# Patient Record
Sex: Male | Born: 1945 | Race: White | Hispanic: No | Marital: Married
Health system: Southern US, Community
[De-identification: ages and names within clinical notes are randomized; demographics above are authoritative.]

## PROBLEM LIST (undated history)

## (undated) DIAGNOSIS — J9621 Acute and chronic respiratory failure with hypoxia: Secondary | ICD-10-CM

## (undated) DIAGNOSIS — I5040 Unspecified combined systolic (congestive) and diastolic (congestive) heart failure: Secondary | ICD-10-CM

## (undated) DIAGNOSIS — U071 COVID-19: Secondary | ICD-10-CM

## (undated) DIAGNOSIS — J1282 Pneumonia due to coronavirus disease 2019: Secondary | ICD-10-CM

## (undated) DIAGNOSIS — I482 Chronic atrial fibrillation, unspecified: Secondary | ICD-10-CM

---

## 2019-02-14 ENCOUNTER — Inpatient Hospital Stay
Admission: RE | Admit: 2019-02-14 | Discharge: 2019-03-21 | Disposition: E | Payer: Medicare HMO | Source: Other Acute Inpatient Hospital | Attending: Internal Medicine | Admitting: Internal Medicine

## 2019-02-14 ENCOUNTER — Other Ambulatory Visit (HOSPITAL_COMMUNITY): Payer: Medicare HMO

## 2019-02-14 DIAGNOSIS — Z9911 Dependence on respirator [ventilator] status: Secondary | ICD-10-CM

## 2019-02-14 DIAGNOSIS — J811 Chronic pulmonary edema: Secondary | ICD-10-CM

## 2019-02-14 DIAGNOSIS — J90 Pleural effusion, not elsewhere classified: Secondary | ICD-10-CM

## 2019-02-14 DIAGNOSIS — J9690 Respiratory failure, unspecified, unspecified whether with hypoxia or hypercapnia: Secondary | ICD-10-CM

## 2019-02-14 DIAGNOSIS — J80 Acute respiratory distress syndrome: Secondary | ICD-10-CM

## 2019-02-14 DIAGNOSIS — J189 Pneumonia, unspecified organism: Secondary | ICD-10-CM

## 2019-02-14 DIAGNOSIS — I482 Chronic atrial fibrillation, unspecified: Secondary | ICD-10-CM | POA: Diagnosis present

## 2019-02-14 DIAGNOSIS — J1289 Other viral pneumonia: Secondary | ICD-10-CM | POA: Diagnosis present

## 2019-02-14 DIAGNOSIS — Z931 Gastrostomy status: Secondary | ICD-10-CM

## 2019-02-14 DIAGNOSIS — R748 Abnormal levels of other serum enzymes: Secondary | ICD-10-CM

## 2019-02-14 DIAGNOSIS — J9621 Acute and chronic respiratory failure with hypoxia: Secondary | ICD-10-CM | POA: Diagnosis present

## 2019-02-14 DIAGNOSIS — U071 COVID-19: Secondary | ICD-10-CM | POA: Diagnosis present

## 2019-02-14 DIAGNOSIS — I5040 Unspecified combined systolic (congestive) and diastolic (congestive) heart failure: Secondary | ICD-10-CM | POA: Diagnosis present

## 2019-02-14 HISTORY — DX: Pneumonia due to coronavirus disease 2019: J12.82

## 2019-02-14 HISTORY — DX: COVID-19: U07.1

## 2019-02-14 HISTORY — DX: Unspecified combined systolic (congestive) and diastolic (congestive) heart failure: I50.40

## 2019-02-14 HISTORY — DX: Chronic atrial fibrillation, unspecified: I48.20

## 2019-02-14 HISTORY — DX: Acute and chronic respiratory failure with hypoxia: J96.21

## 2019-02-15 DIAGNOSIS — I482 Chronic atrial fibrillation, unspecified: Secondary | ICD-10-CM

## 2019-02-15 DIAGNOSIS — J9621 Acute and chronic respiratory failure with hypoxia: Secondary | ICD-10-CM

## 2019-02-15 DIAGNOSIS — U071 COVID-19: Secondary | ICD-10-CM | POA: Diagnosis not present

## 2019-02-15 DIAGNOSIS — J1289 Other viral pneumonia: Secondary | ICD-10-CM

## 2019-02-15 DIAGNOSIS — I5040 Unspecified combined systolic (congestive) and diastolic (congestive) heart failure: Secondary | ICD-10-CM

## 2019-02-15 LAB — COMPREHENSIVE METABOLIC PANEL
ALT: 367 U/L — ABNORMAL HIGH (ref 0–44)
AST: 110 U/L — ABNORMAL HIGH (ref 15–41)
Albumin: 1.4 g/dL — ABNORMAL LOW (ref 3.5–5.0)
Alkaline Phosphatase: 171 U/L — ABNORMAL HIGH (ref 38–126)
Anion gap: 10 (ref 5–15)
BUN: 35 mg/dL — ABNORMAL HIGH (ref 8–23)
CO2: 34 mmol/L — ABNORMAL HIGH (ref 22–32)
Calcium: 7.5 mg/dL — ABNORMAL LOW (ref 8.9–10.3)
Chloride: 92 mmol/L — ABNORMAL LOW (ref 98–111)
Creatinine, Ser: 0.71 mg/dL (ref 0.61–1.24)
GFR calc Af Amer: >60 mL/min (ref >60)
GFR calc non Af Amer: >60 mL/min (ref >60)
Glucose, Bld: 65 mg/dL — ABNORMAL LOW (ref 70–99)
Potassium: 2.8 mmol/L — ABNORMAL LOW (ref 3.5–5.1)
Sodium: 136 mmol/L (ref 135–145)
Total Bilirubin: 1.6 mg/dL — ABNORMAL HIGH (ref 0.3–1.2)
Total Protein: 4.7 g/dL — ABNORMAL LOW (ref 6.5–8.1)

## 2019-02-15 LAB — CBC
HCT: 23.1 % — ABNORMAL LOW (ref 39.0–52.0)
Hemoglobin: 7.1 g/dL — ABNORMAL LOW (ref 13.0–17.0)
MCH: 29.5 pg (ref 26.0–34.0)
MCHC: 30.7 g/dL (ref 30.0–36.0)
MCV: 95.9 fL (ref 80.0–100.0)
Platelets: 147 10*3/uL — ABNORMAL LOW (ref 150–400)
RBC: 2.41 MIL/uL — ABNORMAL LOW (ref 4.22–5.81)
RDW: 22.9 % — ABNORMAL HIGH (ref 11.5–15.5)
WBC: 6.9 10*3/uL (ref 4.0–10.5)
nRBC: 0.3 % — ABNORMAL HIGH (ref 0.0–0.2)

## 2019-02-15 LAB — PREPARE RBC (CROSSMATCH)

## 2019-02-15 LAB — HEMOGLOBIN A1C
Hgb A1c MFr Bld: 6.9 % — ABNORMAL HIGH (ref 4.8–5.6)
Mean Plasma Glucose: 151.33 mg/dL

## 2019-02-15 LAB — ABO/RH: ABO/RH(D): A POS

## 2019-02-15 LAB — TSH: TSH: 3.277 u[IU]/mL (ref 0.350–4.500)

## 2019-02-15 NOTE — Consult Note (Signed)
[facility]  PULMONARY SERVICE  Date of Service: 02/15/2019  PULMONARY CRITICAL CARE Issacc Merlo  TIR:[id number]  DOB: 02/11/1946   DOA: Mar 13, 2019  Referring Physician: Merton Border, MD  HPI: Mike Butler is a 73 y.o. male seen for follow up of Acute on Chronic Respiratory Failure.  Patient has multiple medical problems including morbid obesity diabetes coronary artery disease status post CABG carotid endarterectomy hyperlipidemia chronic back pain sleep apnea atrial fibrillation who presented to the hospital because of increasing shortness of breath.  Patient apparently was diagnosed with COVID-19 on September 21 was initially sent home on oxygen but came back because of worsening condition.  Patient went on to respiratory failure ended up having a cardiac arrest and this was felt to be secondary to hypoxia.  Subsequently patient was treated for the pneumonia with the steroids remdesivir combination.  Patient subsequently was extubated but failed and had to be reintubated the following day.  Patient now transferred to our facility for further management and weaning.  Review of Systems:  ROS performed and is unremarkable other than noted above.  Past medical history: Morbid obesity Diabetes Carotid stenosis Coronary artery disease Hyperlipidemia Sick sinus syndrome Atrial fibrillation Sleep apnea Chronic pain  Past surgical history: CABG Carotid endarterectomy Pacemaker placement Cholecystectomy Bilateral knee replacement  Family history: Noncontributory  Social history: Never smoker No alcohol abuse No drug abuse  Allergies: Codeine   Medications: Reviewed on Rounds  Physical Exam:  Vitals: Temperature 97.7 pulse 79 respiratory rate 30 blood pressure 123/60 saturations 98%  Ventilator Settings mode ventilation pressure support FiO2 50% tidal volume 396 pressure poor 12 PEEP 5  . General:  Comfortable at this time . Eyes: Grossly normal lids, irises & conjunctiva . ENT: grossly tongue is normal . Neck: no obvious mass . Cardiovascular: S1-S2 normal no gallop or rub . Respiratory: No rhonchi no rales are noted at this time . Abdomen: Soft and nontender . Skin: no rash seen on limited exam . Musculoskeletal: not rigid . Psychiatric:unable to assess . Neurologic: no seizure no involuntary movements         Labs on Admission:  Basic Metabolic Panel: Recent Labs  Lab 02/15/19 0226  NA 136  K 2.8*  CL 92*  CO2 34*  GLUCOSE 65*  BUN 35*  CREATININE 0.71  CALCIUM 7.5*    No results for input(s): PHART, PCO2ART, PO2ART, HCO3, O2SAT in the last 168 hours.  Liver Function Tests: Recent Labs  Lab 02/15/19 0226  AST 110*  ALT 367*  ALKPHOS 171*  BILITOT 1.6*  PROT 4.7*  ALBUMIN 1.4*   No results for input(s): LIPASE, AMYLASE in the last 168 hours. No results for input(s): AMMONIA in the last 168 hours.  CBC: Recent Labs  Lab 02/15/19 0226  WBC 6.9  HGB 7.1*  HCT 23.1*  MCV 95.9  PLT 147*    Cardiac Enzymes: No results for input(s): CKTOTAL, CKMB, CKMBINDEX, TROPONINI in the last 168 hours.  BNP (last 3 results) No results for input(s): BNP in the last 8760 hours.  ProBNP (last 3 results) No results for input(s): PROBNP in the last 8760 hours.   Radiological Exams on Admission: Dg Abd 1 View  Result Date: 03/13/2019 CLINICAL DATA:  NG tube placement. EXAM: ABDOMEN - 1 VIEW COMPARISON:  None. FINDINGS: Enteric tube in the stomach. The visualized bowel gas pattern is normal. No radio-opaque calculi or other significant radiographic abnormality are seen. Prior lumbar fusion.  No acute osseous abnormality. IMPRESSION: Enteric tube in the stomach. Electronically Signed   By: Obie Dredge M.D.   On: 02/20/19 21:08   Dg Chest Port 1 View  Result Date: 02/20/2019 CLINICAL DATA:  Respiratory failure EXAM: PORTABLE CHEST 1 VIEW COMPARISON:  None.  FINDINGS: Cardiomegaly and findings of CABG. Left chest wall pacemaker. Mild interstitial pulmonary edema. No pneumothorax. Small left pleural effusion. Enteric tube courses below the field of view. IMPRESSION: 1. Findings of congestive heart failure: Mild interstitial pulmonary edema, cardiomegaly and small left pleural effusion. 2. Enteric tube courses below the field-of-view. Electronically Signed   By: Deatra Robinson M.D.   On: 02/20/19 22:42    Assessment/Plan Active Problems:   Acute on chronic respiratory failure with hypoxia (HCC)   COVID-19 virus infection   Pneumonia due to COVID-19 virus   Chronic atrial fibrillation (HCC)   Congestive heart failure, NYHA class 3, unspecified failure chronicity, combined (HCC)   1. Acute on chronic respiratory failure hypoxia patient is on the wean on pressure support try to advance as tolerated last chest x-ray showing some congestion and interstitial edema may benefit from diuretics. 2. COVID-19 virus infection now in recovery phase continue with present management monitoring. 3. COVID-19 pneumonia slowly improving the last chest x-ray still shows some evidence of congestive heart failure 4. Chronic atrial fibrillation rate controlled patient was put on Eliquis and also on his regular medications. 5. Congestive heart failure patient noted to have large heart and effusions on the latest chest film.  Will discuss with primary care team regarding work-up.  Patient does have a history of coronary artery disease and CABG  I have personally seen and evaluated the patient, evaluated laboratory and imaging results, formulated the assessment and plan and placed orders. The Patient requires high complexity decision making for assessment and support.  Case was discussed on Rounds with the Respiratory Therapy Staff Time Spent  Yevonne Pax, MD [facility] Pulmonary Critical Care Medicine Sleep Medicine

## 2019-02-16 ENCOUNTER — Other Ambulatory Visit (HOSPITAL_COMMUNITY): Payer: Medicare HMO

## 2019-02-16 DIAGNOSIS — I5040 Unspecified combined systolic (congestive) and diastolic (congestive) heart failure: Secondary | ICD-10-CM | POA: Diagnosis not present

## 2019-02-16 DIAGNOSIS — I482 Chronic atrial fibrillation, unspecified: Secondary | ICD-10-CM | POA: Diagnosis not present

## 2019-02-16 DIAGNOSIS — J9621 Acute and chronic respiratory failure with hypoxia: Secondary | ICD-10-CM | POA: Diagnosis not present

## 2019-02-16 DIAGNOSIS — U071 COVID-19: Secondary | ICD-10-CM | POA: Diagnosis not present

## 2019-02-16 LAB — RENAL FUNCTION PANEL
Albumin: 1.4 g/dL — ABNORMAL LOW (ref 3.5–5.0)
Anion gap: 12 (ref 5–15)
BUN: 35 mg/dL — ABNORMAL HIGH (ref 8–23)
CO2: 31 mmol/L (ref 22–32)
Calcium: 7.7 mg/dL — ABNORMAL LOW (ref 8.9–10.3)
Chloride: 92 mmol/L — ABNORMAL LOW (ref 98–111)
Creatinine, Ser: 0.65 mg/dL (ref 0.61–1.24)
GFR calc Af Amer: >60 mL/min (ref >60)
GFR calc non Af Amer: >60 mL/min (ref >60)
Glucose, Bld: 162 mg/dL — ABNORMAL HIGH (ref 70–99)
Phosphorus: 3.9 mg/dL (ref 2.5–4.6)
Potassium: 3.2 mmol/L — ABNORMAL LOW (ref 3.5–5.1)
Sodium: 135 mmol/L (ref 135–145)

## 2019-02-16 LAB — CBC
HCT: 26.6 % — ABNORMAL LOW (ref 39.0–52.0)
Hemoglobin: 8.4 g/dL — ABNORMAL LOW (ref 13.0–17.0)
MCH: 29.9 pg (ref 26.0–34.0)
MCHC: 31.6 g/dL (ref 30.0–36.0)
MCV: 94.7 fL (ref 80.0–100.0)
Platelets: 134 10*3/uL — ABNORMAL LOW (ref 150–400)
RBC: 2.81 MIL/uL — ABNORMAL LOW (ref 4.22–5.81)
RDW: 21.7 % — ABNORMAL HIGH (ref 11.5–15.5)
WBC: 6.6 10*3/uL (ref 4.0–10.5)
nRBC: 0.6 % — ABNORMAL HIGH (ref 0.0–0.2)

## 2019-02-16 LAB — BPAM RBC
Blood Product Expiration Date: 202011202359
ISSUE DATE / TIME: 202010280432
Unit Type and Rh: 6200

## 2019-02-16 LAB — TYPE AND SCREEN
ABO/RH(D): A POS
Antibody Screen: NEGATIVE
Unit division: 0

## 2019-02-16 LAB — MAGNESIUM: Magnesium: 1.8 mg/dL (ref 1.7–2.4)

## 2019-02-16 NOTE — Progress Notes (Signed)
Pulmonary Critical Care Medicine [facility] [place]   PULMONARY CRITICAL CARE SERVICE  PROGRESS NOTE  Date of Service: 02/16/2019  Dimitry Holsworth  NAT:[id number]  DOB: 05-06-1945   DOA: 03-16-2019  Referring Physician: Carron Curie, MD  HPI: Leelan Rajewski is a 73 y.o. male seen for follow up of Acute on Chronic Respiratory Failure.  Patient currently is on pressure support wean and has been doing well with 40% FiO2 good saturations are noted the goal is for about 8 hours  Medications: Reviewed on Rounds  Physical Exam:  Vitals: Temperature 96.8 pulse 79 respiratory rate 28 blood pressure 122/54 saturations 96%  Ventilator Settings mode ventilation pressure support FiO2 40% tidal volume 575 pressure support 12 PEEP 7  . General: Comfortable at this time . Eyes: Grossly normal lids, irises & conjunctiva . ENT: grossly tongue is normal . Neck: no obvious mass . Cardiovascular: S1 S2 normal no gallop . Respiratory: No rhonchi coarse breath sounds . Abdomen: soft . Skin: no rash seen on limited exam . Musculoskeletal: not rigid . Psychiatric:unable to assess . Neurologic: no seizure no involuntary movements         Lab Data:   Basic Metabolic Panel: Recent Labs  Lab 02/15/19 0226 02/16/19 0631  NA 136 135  K 2.8* 3.2*  CL 92* 92*  CO2 34* 31  GLUCOSE 65* 162*  BUN 35* 35*  CREATININE 0.71 0.65  CALCIUM 7.5* 7.7*  MG  --  1.8  PHOS  --  3.9    ABG: No results for input(s): PHART, PCO2ART, PO2ART, HCO3, O2SAT in the last 168 hours.  Liver Function Tests: Recent Labs  Lab 02/15/19 0226 02/16/19 0631  AST 110*  --   ALT 367*  --   ALKPHOS 171*  --   BILITOT 1.6*  --   PROT 4.7*  --   ALBUMIN 1.4* 1.4*   No results for input(s): LIPASE, AMYLASE in the last 168 hours. No results for input(s): AMMONIA in the last 168 hours.  CBC: Recent Labs  Lab 02/15/19 0226 02/16/19 0631  WBC 6.9 6.6  HGB 7.1* 8.4*  HCT 23.1* 26.6*  MCV 95.9 94.7   PLT 147* 134*    Cardiac Enzymes: No results for input(s): CKTOTAL, CKMB, CKMBINDEX, TROPONINI in the last 168 hours.  BNP (last 3 results) No results for input(s): BNP in the last 8760 hours.  ProBNP (last 3 results) No results for input(s): PROBNP in the last 8760 hours.  Radiological Exams: Dg Abd 1 View  Result Date: March 16, 2019 CLINICAL DATA:  NG tube placement. EXAM: ABDOMEN - 1 VIEW COMPARISON:  None. FINDINGS: Enteric tube in the stomach. The visualized bowel gas pattern is normal. No radio-opaque calculi or other significant radiographic abnormality are seen. Prior lumbar fusion. No acute osseous abnormality. IMPRESSION: Enteric tube in the stomach. Electronically Signed   By: Obie Dredge M.D.   On: 03-16-2019 21:08   Dg Chest Port 1 View  Result Date: 03-16-2019 CLINICAL DATA:  Respiratory failure EXAM: PORTABLE CHEST 1 VIEW COMPARISON:  None. FINDINGS: Cardiomegaly and findings of CABG. Left chest wall pacemaker. Mild interstitial pulmonary edema. No pneumothorax. Small left pleural effusion. Enteric tube courses below the field of view. IMPRESSION: 1. Findings of congestive heart failure: Mild interstitial pulmonary edema, cardiomegaly and small left pleural effusion. 2. Enteric tube courses below the field-of-view. Electronically Signed   By: Deatra Robinson M.D.   On: 16-Mar-2019 22:42    Assessment/Plan Active Problems:   Acute on chronic  respiratory failure with hypoxia ([place])   COVID-19 virus infection   Pneumonia due to COVID-19 virus   Chronic atrial fibrillation (HCC)   Congestive heart failure, NYHA class 3, unspecified failure chronicity, combined ([place])   1. Acute on chronic respiratory failure with hypoxia patient continues to do okay with the wean on pressure support plan is to advance as tolerated.  The goal today is 8 hours 2. COVID-19 virus infection in recovery phase 3. Pneumonia due to COVID-19 we will continue with supportive care need to keep on the  conservative fluid side 4. Congestive heart failure as above need to monitor fluid status closely 5. Chronic atrial fibrillation rate controlled   I have personally seen and evaluated the patient, evaluated laboratory and imaging results, formulated the assessment and plan and placed orders. The Patient requires high complexity decision making for assessment and support.  Case was discussed on Rounds with the Respiratory Therapy Staff  Allyne Gee, MD [facility] Pulmonary Critical Care Medicine Sleep Medicine

## 2019-02-17 ENCOUNTER — Encounter: Payer: Self-pay | Admitting: Internal Medicine

## 2019-02-17 DIAGNOSIS — J1282 Pneumonia due to coronavirus disease 2019: Secondary | ICD-10-CM | POA: Diagnosis present

## 2019-02-17 DIAGNOSIS — I5040 Unspecified combined systolic (congestive) and diastolic (congestive) heart failure: Secondary | ICD-10-CM | POA: Diagnosis present

## 2019-02-17 DIAGNOSIS — I482 Chronic atrial fibrillation, unspecified: Secondary | ICD-10-CM | POA: Diagnosis not present

## 2019-02-17 DIAGNOSIS — J9621 Acute and chronic respiratory failure with hypoxia: Secondary | ICD-10-CM | POA: Diagnosis present

## 2019-02-17 DIAGNOSIS — U071 COVID-19: Secondary | ICD-10-CM | POA: Diagnosis present

## 2019-02-17 LAB — RENAL FUNCTION PANEL
Albumin: 1.4 g/dL — ABNORMAL LOW (ref 3.5–5.0)
Anion gap: 9 (ref 5–15)
BUN: 40 mg/dL — ABNORMAL HIGH (ref 8–23)
CO2: 34 mmol/L — ABNORMAL HIGH (ref 22–32)
Calcium: 7.9 mg/dL — ABNORMAL LOW (ref 8.9–10.3)
Chloride: 93 mmol/L — ABNORMAL LOW (ref 98–111)
Creatinine, Ser: 0.68 mg/dL (ref 0.61–1.24)
GFR calc Af Amer: >60 mL/min (ref >60)
GFR calc non Af Amer: >60 mL/min (ref >60)
Glucose, Bld: 144 mg/dL — ABNORMAL HIGH (ref 70–99)
Phosphorus: 3.5 mg/dL (ref 2.5–4.6)
Potassium: 3 mmol/L — ABNORMAL LOW (ref 3.5–5.1)
Sodium: 136 mmol/L (ref 135–145)

## 2019-02-17 LAB — CBC
HCT: 24.3 % — ABNORMAL LOW (ref 39.0–52.0)
Hemoglobin: 7.6 g/dL — ABNORMAL LOW (ref 13.0–17.0)
MCH: 30.3 pg (ref 26.0–34.0)
MCHC: 31.3 g/dL (ref 30.0–36.0)
MCV: 96.8 fL (ref 80.0–100.0)
Platelets: 161 10*3/uL (ref 150–400)
RBC: 2.51 MIL/uL — ABNORMAL LOW (ref 4.22–5.81)
RDW: 22 % — ABNORMAL HIGH (ref 11.5–15.5)
WBC: 5.6 10*3/uL (ref 4.0–10.5)
nRBC: 1.1 % — ABNORMAL HIGH (ref 0.0–0.2)

## 2019-02-17 LAB — MAGNESIUM: Magnesium: 1.7 mg/dL (ref 1.7–2.4)

## 2019-02-17 NOTE — Progress Notes (Signed)
Pulmonary Critical Care Medicine [facility] [place]   PULMONARY CRITICAL CARE SERVICE  PROGRESS NOTE  Date of Service: 02/17/2019  Mike Butler  VQM:[id number]  DOB: 06/13/1945   DOA: 02/13/2019  Referring Physician: Carron Curie, MD  HPI: Mike Butler is a 73 y.o. male seen for follow up of Acute on Chronic Respiratory Failure.  Patient currently is on pressure support mode 12/7 has been tolerating fairly well today  Medications: Reviewed on Rounds  Physical Exam:  Vitals: Temperature 98.7 pulse 74 respiratory 25 blood pressure 132/63 saturations 95%  Ventilator Settings mode ventilation pressure support FiO2 35% tidal volume 393 pressure poor 12 PEEP 7  . General: Comfortable at this time . Eyes: Grossly normal lids, irises & conjunctiva . ENT: grossly tongue is normal . Neck: no obvious mass . Cardiovascular: S1 S2 normal no gallop . Respiratory: No rhonchi no rales are noted at this time . Abdomen: soft . Skin: no rash seen on limited exam . Musculoskeletal: not rigid . Psychiatric:unable to assess . Neurologic: no seizure no involuntary movements         Lab Data:   Basic Metabolic Panel: Recent Labs  Lab 02/15/19 0226 02/16/19 0631 02/17/19 0500  NA 136 135 136  K 2.8* 3.2* 3.0*  CL 92* 92* 93*  CO2 34* 31 34*  GLUCOSE 65* 162* 144*  BUN 35* 35* 40*  CREATININE 0.71 0.65 0.68  CALCIUM 7.5* 7.7* 7.9*  MG  --  1.8 1.7  PHOS  --  3.9 3.5    ABG: No results for input(s): PHART, PCO2ART, PO2ART, HCO3, O2SAT in the last 168 hours.  Liver Function Tests: Recent Labs  Lab 02/15/19 0226 02/16/19 0631 02/17/19 0500  AST 110*  --   --   ALT 367*  --   --   ALKPHOS 171*  --   --   BILITOT 1.6*  --   --   PROT 4.7*  --   --   ALBUMIN 1.4* 1.4* 1.4*   No results for input(s): LIPASE, AMYLASE in the last 168 hours. No results for input(s): AMMONIA in the last 168 hours.  CBC: Recent Labs  Lab 02/15/19 0226 02/16/19 0631  02/17/19 0500  WBC 6.9 6.6 5.6  HGB 7.1* 8.4* 7.6*  HCT 23.1* 26.6* 24.3*  MCV 95.9 94.7 96.8  PLT 147* 134* 161    Cardiac Enzymes: No results for input(s): CKTOTAL, CKMB, CKMBINDEX, TROPONINI in the last 168 hours.  BNP (last 3 results) No results for input(s): BNP in the last 8760 hours.  ProBNP (last 3 results) No results for input(s): PROBNP in the last 8760 hours.  Radiological Exams: US Abdomen Limited Ruq  Result Date: 02/16/2019 CLINICAL DATA:  Elevated LFT EXAM: ULTRASOUND ABDOMEN LIMITED RIGHT UPPER QUADRANT COMPARISON:  None. FINDINGS: Gallbladder: Unable to be visualized likely due to physical condition and technical factors. Common bile duct: Diameter: 3.4 mm Liver: Poorly visible. Appears hypoechoic and dense. No gross focal abnormality. Portal vein is patent on color Doppler imaging with normal direction of blood flow towards the liver. Other: None. IMPRESSION: 1. Gallbladder was unable to be visualized. 2. Hypoechoic difficult to penetrate liver, question acute hepatitis. No gross focal hepatic abnormality. Electronically Signed   By: Jasmine Pang M.D.   On: 02/16/2019 19:43    Assessment/Plan Active Problems:   Acute on chronic respiratory failure with hypoxia (HCC)   COVID-19 virus infection   Pneumonia due to COVID-19 virus   Chronic atrial fibrillation (HCC)  Congestive heart failure, NYHA class 3, unspecified failure chronicity, combined ([place])   1. Acute on chronic respiratory failure hypoxia we will continue with weaning on pressure support titrate as tolerated 2. COVID-19 virus infection in recovery phase 3. Pneumonia due to COVID-19 treated 4. Chronic atrial fibrillation rate controlled 5. Congestive heart failure we will continue with supportive care   I have personally seen and evaluated the patient, evaluated laboratory and imaging results, formulated the assessment and plan and placed orders. The Patient requires high complexity decision making  for assessment and support.  Case was discussed on Rounds with the Respiratory Therapy Staff  Allyne Gee, MD [facility] Pulmonary Critical Care Medicine Sleep Medicine

## 2019-02-18 DIAGNOSIS — J9621 Acute and chronic respiratory failure with hypoxia: Secondary | ICD-10-CM | POA: Diagnosis not present

## 2019-02-18 DIAGNOSIS — I482 Chronic atrial fibrillation, unspecified: Secondary | ICD-10-CM | POA: Diagnosis not present

## 2019-02-18 DIAGNOSIS — I5040 Unspecified combined systolic (congestive) and diastolic (congestive) heart failure: Secondary | ICD-10-CM | POA: Diagnosis not present

## 2019-02-18 DIAGNOSIS — U071 COVID-19: Secondary | ICD-10-CM | POA: Diagnosis not present

## 2019-02-18 LAB — COMPREHENSIVE METABOLIC PANEL WITH GFR
ALT: 156 U/L — ABNORMAL HIGH (ref 0–44)
AST: 42 U/L — ABNORMAL HIGH (ref 15–41)
Albumin: 1.7 g/dL — ABNORMAL LOW (ref 3.5–5.0)
Alkaline Phosphatase: 174 U/L — ABNORMAL HIGH (ref 38–126)
Anion gap: 10 (ref 5–15)
BUN: 40 mg/dL — ABNORMAL HIGH (ref 8–23)
CO2: 34 mmol/L — ABNORMAL HIGH (ref 22–32)
Calcium: 8 mg/dL — ABNORMAL LOW (ref 8.9–10.3)
Chloride: 92 mmol/L — ABNORMAL LOW (ref 98–111)
Creatinine, Ser: 0.69 mg/dL (ref 0.61–1.24)
GFR calc Af Amer: >60 mL/min
GFR calc non Af Amer: >60 mL/min
Glucose, Bld: 168 mg/dL — ABNORMAL HIGH (ref 70–99)
Potassium: 3.7 mmol/L (ref 3.5–5.1)
Sodium: 136 mmol/L (ref 135–145)
Total Bilirubin: 0.9 mg/dL (ref 0.3–1.2)
Total Protein: 4.8 g/dL — ABNORMAL LOW (ref 6.5–8.1)

## 2019-02-18 LAB — CBC
HCT: 25.9 % — ABNORMAL LOW (ref 39.0–52.0)
Hemoglobin: 8 g/dL — ABNORMAL LOW (ref 13.0–17.0)
MCH: 30.1 pg (ref 26.0–34.0)
MCHC: 30.9 g/dL (ref 30.0–36.0)
MCV: 97.4 fL (ref 80.0–100.0)
Platelets: 170 10*3/uL (ref 150–400)
RBC: 2.66 MIL/uL — ABNORMAL LOW (ref 4.22–5.81)
RDW: 22.3 % — ABNORMAL HIGH (ref 11.5–15.5)
WBC: 6.2 10*3/uL (ref 4.0–10.5)
nRBC: 1.8 % — ABNORMAL HIGH (ref 0.0–0.2)

## 2019-02-18 LAB — PHOSPHORUS: Phosphorus: 3.3 mg/dL (ref 2.5–4.6)

## 2019-02-18 LAB — MAGNESIUM: Magnesium: 2 mg/dL (ref 1.7–2.4)

## 2019-02-18 NOTE — Progress Notes (Signed)
Pulmonary Critical Care Medicine Mike Butler   PULMONARY CRITICAL CARE SERVICE  PROGRESS NOTE  Date of Service: 02/18/2019  Mike Butler  QVZ:[id number]  DOB: 1946/03/22   DOA: 02/16/2019  Referring Physician: Merton Border, MD  HPI: Mike Butler is a 73 y.o. male seen for follow up of Acute on Chronic Respiratory Failure.  Patient right now is on a wean protocol 16-hour goal on the pressure support  Medications: Reviewed on Rounds  Physical Exam:  Vitals: Temperature 98.9 pulse 76 respiratory rate 27 blood pressure 116/58 saturations 95%  Ventilator Settings mode ventilation pressure support FiO2 35% pressure 12 PEEP 5  . General: Comfortable at this time . Eyes: Grossly normal lids, irises & conjunctiva . ENT: grossly tongue is normal . Neck: no obvious mass . Cardiovascular: S1 S2 normal no gallop . Respiratory: No rhonchi no rales are noted at this time . Abdomen: soft . Skin: no rash seen on limited exam . Musculoskeletal: not rigid . Psychiatric:unable to assess . Neurologic: no seizure no involuntary movements         Lab Data:   Basic Metabolic Panel: Recent Labs  Lab 02/15/19 0226 02/16/19 0631 02/17/19 0500 02/18/19 0714  NA 136 135 136 136  K 2.8* 3.2* 3.0* 3.7  CL 92* 92* 93* 92*  CO2 34* 31 34* 34*  GLUCOSE 65* 162* 144* 168*  BUN 35* 35* 40* 40*  CREATININE 0.71 0.65 0.68 0.69  CALCIUM 7.5* 7.7* 7.9* 8.0*  MG  --  1.8 1.7 2.0  PHOS  --  3.9 3.5 3.3    ABG: No results for input(s): PHART, PCO2ART, PO2ART, HCO3, O2SAT in the last 168 hours.  Liver Function Tests: Recent Labs  Lab 02/15/19 0226 02/16/19 0631 02/17/19 0500 02/18/19 0714  AST 110*  --   --  42*  ALT 367*  --   --  156*  ALKPHOS 171*  --   --  174*  BILITOT 1.6*  --   --  0.9  PROT 4.7*  --   --  4.8*  ALBUMIN 1.4* 1.4* 1.4* 1.7*   No results for input(s): LIPASE, AMYLASE in the last 168 hours. No results for input(s): AMMONIA in the last 168  hours.  CBC: Recent Labs  Lab 02/15/19 0226 02/16/19 0631 02/17/19 0500 02/18/19 0714  WBC 6.9 6.6 5.6 6.2  HGB 7.1* 8.4* 7.6* 8.0*  HCT 23.1* 26.6* 24.3* 25.9*  MCV 95.9 94.7 96.8 97.4  PLT 147* 134* 161 170    Cardiac Enzymes: No results for input(s): CKTOTAL, CKMB, CKMBINDEX, TROPONINI in the last 168 hours.  BNP (last 3 results) No results for input(s): BNP in the last 8760 hours.  ProBNP (last 3 results) No results for input(s): PROBNP in the last 8760 hours.  Radiological Exams: US Abdomen Limited Ruq  Result Date: 02/16/2019 CLINICAL DATA:  Elevated LFT EXAM: ULTRASOUND ABDOMEN LIMITED RIGHT UPPER QUADRANT COMPARISON:  None. FINDINGS: Gallbladder: Unable to be visualized likely due to physical condition and technical factors. Common bile duct: Diameter: 3.4 mm Liver: Poorly visible. Appears hypoechoic and dense. No gross focal abnormality. Portal vein is patent on color Doppler imaging with normal direction of blood flow towards the liver. Other: None. IMPRESSION: 1. Gallbladder was unable to be visualized. 2. Hypoechoic difficult to penetrate liver, question acute hepatitis. No gross focal hepatic abnormality. Electronically Signed   By: Donavan Foil M.D.   On: 02/16/2019 19:43    Assessment/Plan Active Problems:   Acute on chronic respiratory  failure with hypoxia (HCC)   COVID-19 virus infection   Pneumonia due to COVID-19 virus   Chronic atrial fibrillation (HCC)   Congestive heart failure, NYHA class 3, unspecified failure chronicity, combined (HCC)   1. Acute on chronic respiratory failure with hypoxia we will continue with the weaning protocol 16-hour goal 2. COVID-19 virus infection in recovery phase 3. Pneumonia due to COVID-19 will continue with present management 4. Chronic atrial fibrillation rate controlled 5. Congestive heart failure monitor fluid status   I have personally seen and evaluated the patient, evaluated laboratory and imaging results,  formulated the assessment and plan and placed orders. The Patient requires high complexity decision making for assessment and support.  Case was discussed on Rounds with the Respiratory Therapy Staff  Yevonne Pax, MD [facility] Pulmonary Critical Care Medicine Sleep Medicine

## 2019-02-19 DIAGNOSIS — I5040 Unspecified combined systolic (congestive) and diastolic (congestive) heart failure: Secondary | ICD-10-CM | POA: Diagnosis not present

## 2019-02-19 DIAGNOSIS — I482 Chronic atrial fibrillation, unspecified: Secondary | ICD-10-CM | POA: Diagnosis not present

## 2019-02-19 DIAGNOSIS — J9621 Acute and chronic respiratory failure with hypoxia: Secondary | ICD-10-CM | POA: Diagnosis not present

## 2019-02-19 DIAGNOSIS — U071 COVID-19: Secondary | ICD-10-CM | POA: Diagnosis not present

## 2019-02-19 NOTE — Progress Notes (Signed)
Pulmonary Critical Care Medicine [place]   PULMONARY CRITICAL CARE SERVICE  PROGRESS NOTE  Date of Service: 02/19/2019  Mike Butler  ASN:[id number]  DOB: June 28, 1945   DOA: 02/07/2019  Referring Physician: Merton Border, MD  HPI: Mike Butler is a 73 y.o. male seen for follow up of Acute on Chronic Respiratory Failure.  Patient is right now on pressure support the goal for today is to try T collar for 2 hours  Medications: Reviewed on Rounds  Physical Exam:  Vitals: Temperature 98.1 pulse 71 respiratory 29 blood pressure 142/62 saturations 92%  Ventilator Settings mode ventilation pressure support FiO2 45% pressure support 12 PEEP 5  . General: Comfortable at this time . Eyes: Grossly normal lids, irises & conjunctiva . ENT: grossly tongue is normal . Neck: no obvious mass . Cardiovascular: S1 S2 normal no gallop . Respiratory: No rhonchi no rales are noted at this time . Abdomen: soft . Skin: no rash seen on limited exam . Musculoskeletal: not rigid . Psychiatric:unable to assess . Neurologic: no seizure no involuntary movements         Lab Data:   Basic Metabolic Panel: Recent Labs  Lab 02/15/19 0226 02/16/19 0631 02/17/19 0500 02/18/19 0714  NA 136 135 136 136  K 2.8* 3.2* 3.0* 3.7  CL 92* 92* 93* 92*  CO2 34* 31 34* 34*  GLUCOSE 65* 162* 144* 168*  BUN 35* 35* 40* 40*  CREATININE 0.71 0.65 0.68 0.69  CALCIUM 7.5* 7.7* 7.9* 8.0*  MG  --  1.8 1.7 2.0  PHOS  --  3.9 3.5 3.3    ABG: No results for input(s): PHART, PCO2ART, PO2ART, HCO3, O2SAT in the last 168 hours.  Liver Function Tests: Recent Labs  Lab 02/15/19 0226 02/16/19 0631 02/17/19 0500 02/18/19 0714  AST 110*  --   --  42*  ALT 367*  --   --  156*  ALKPHOS 171*  --   --  174*  BILITOT 1.6*  --   --  0.9  PROT 4.7*  --   --  4.8*  ALBUMIN 1.4* 1.4* 1.4* 1.7*   No results for input(s): LIPASE, AMYLASE in the last 168 hours. No results for input(s): AMMONIA in  the last 168 hours.  CBC: Recent Labs  Lab 02/15/19 0226 02/16/19 0631 02/17/19 0500 02/18/19 0714  WBC 6.9 6.6 5.6 6.2  HGB 7.1* 8.4* 7.6* 8.0*  HCT 23.1* 26.6* 24.3* 25.9*  MCV 95.9 94.7 96.8 97.4  PLT 147* 134* 161 170    Cardiac Enzymes: No results for input(s): CKTOTAL, CKMB, CKMBINDEX, TROPONINI in the last 168 hours.  BNP (last 3 results) No results for input(s): BNP in the last 8760 hours.  ProBNP (last 3 results) No results for input(s): PROBNP in the last 8760 hours.  Radiological Exams: No results found.  Assessment/Plan Active Problems:   Acute on chronic respiratory failure with hypoxia (HCC)   COVID-19 virus infection   Pneumonia due to COVID-19 virus   Chronic atrial fibrillation (HCC)   Congestive heart failure, NYHA class 3, unspecified failure chronicity, combined ([place])   1. Acute on chronic respiratory failure hypoxia continue to wean today the goal is for T collar for 2 hours 2. COVID-19 virus infection in recovery phase 3. Pneumonia due to COVID-19 treated 4. Chronic atrial fibrillation rate controlled 5. Congestive heart failure right now compensated   I have personally seen and evaluated the patient, evaluated laboratory and imaging results, formulated the assessment and plan  and placed orders. The Patient requires high complexity decision making for assessment and support.  Case was discussed on Rounds with the Respiratory Therapy Staff  Allyne Gee, MD [facility] Pulmonary Critical Care Medicine Sleep Medicine

## 2019-02-19 DEATH — deceased

## 2019-02-20 DIAGNOSIS — I482 Chronic atrial fibrillation, unspecified: Secondary | ICD-10-CM | POA: Diagnosis not present

## 2019-02-20 DIAGNOSIS — I5040 Unspecified combined systolic (congestive) and diastolic (congestive) heart failure: Secondary | ICD-10-CM | POA: Diagnosis not present

## 2019-02-20 DIAGNOSIS — U071 COVID-19: Secondary | ICD-10-CM | POA: Diagnosis not present

## 2019-02-20 DIAGNOSIS — J9621 Acute and chronic respiratory failure with hypoxia: Secondary | ICD-10-CM | POA: Diagnosis not present

## 2019-02-20 LAB — COMPREHENSIVE METABOLIC PANEL
ALT: 95 U/L — ABNORMAL HIGH (ref 0–44)
AST: 38 U/L (ref 15–41)
Albumin: 1.8 g/dL — ABNORMAL LOW (ref 3.5–5.0)
Alkaline Phosphatase: 166 U/L — ABNORMAL HIGH (ref 38–126)
Anion gap: 14 (ref 5–15)
BUN: 46 mg/dL — ABNORMAL HIGH (ref 8–23)
CO2: 29 mmol/L (ref 22–32)
Calcium: 8.2 mg/dL — ABNORMAL LOW (ref 8.9–10.3)
Chloride: 94 mmol/L — ABNORMAL LOW (ref 98–111)
Creatinine, Ser: 0.69 mg/dL (ref 0.61–1.24)
GFR calc Af Amer: >60 mL/min (ref >60)
GFR calc non Af Amer: >60 mL/min (ref >60)
Glucose, Bld: 184 mg/dL — ABNORMAL HIGH (ref 70–99)
Potassium: 3.6 mmol/L (ref 3.5–5.1)
Sodium: 137 mmol/L (ref 135–145)
Total Bilirubin: 0.9 mg/dL (ref 0.3–1.2)
Total Protein: 5 g/dL — ABNORMAL LOW (ref 6.5–8.1)

## 2019-02-20 LAB — PHOSPHORUS: Phosphorus: 3.4 mg/dL (ref 2.5–4.6)

## 2019-02-20 LAB — CBC
HCT: 23.7 % — ABNORMAL LOW (ref 39.0–52.0)
Hemoglobin: 7.4 g/dL — ABNORMAL LOW (ref 13.0–17.0)
MCH: 30.7 pg (ref 26.0–34.0)
MCHC: 31.2 g/dL (ref 30.0–36.0)
MCV: 98.3 fL (ref 80.0–100.0)
Platelets: 180 10*3/uL (ref 150–400)
RBC: 2.41 MIL/uL — ABNORMAL LOW (ref 4.22–5.81)
RDW: 22.6 % — ABNORMAL HIGH (ref 11.5–15.5)
WBC: 5.6 10*3/uL (ref 4.0–10.5)
nRBC: 2.9 % — ABNORMAL HIGH (ref 0.0–0.2)

## 2019-02-20 LAB — MAGNESIUM: Magnesium: 1.9 mg/dL (ref 1.7–2.4)

## 2019-02-20 NOTE — Progress Notes (Signed)
Pulmonary Critical Care Medicine [facility] [place]   PULMONARY CRITICAL CARE SERVICE  PROGRESS NOTE  Date of Service: 02/20/2019  Mike Butler  [id number]:[id number]  DOB: March 21, 1946   DOA: 01/25/2019  Referring Physician: Carron Curie, MD  HPI: Mike Butler is a 73 y.o. male seen for follow up of Acute on Chronic Respiratory Failure.  Patient currently is on pressure support mode has been on 45% FiO2 with good saturations.  Medications: Reviewed on Rounds  Physical Exam:  Vitals: Temperature 98.1 pulse 76 respiratory rate 27 blood pressure 138/78 saturations 96%  Ventilator Settings mode of ventilation pressure support FiO2 45% tidal volume 430 pressure poor 12 PEEP 5  . General: Comfortable at this time . Eyes: Grossly normal lids, irises & conjunctiva . ENT: grossly tongue is normal . Neck: no obvious mass . Cardiovascular: S1 S2 normal no gallop . Respiratory: No rhonchi no rales are noted at this time . Abdomen: soft . Skin: no rash seen on limited exam . Musculoskeletal: not rigid . Psychiatric:unable to assess . Neurologic: no seizure no involuntary movements         Lab Data:   Basic Metabolic Panel: Recent Labs  Lab 02/15/19 0226 02/16/19 0631 02/17/19 0500 02/18/19 0714 02/20/19 0743  NA 136 135 136 136 137  K 2.8* 3.2* 3.0* 3.7 3.6  CL 92* 92* 93* 92* 94*  CO2 34* 31 34* 34* 29  GLUCOSE 65* 162* 144* 168* 184*  BUN 35* 35* 40* 40* 46*  CREATININE 0.71 0.65 0.68 0.69 0.69  CALCIUM 7.5* 7.7* 7.9* 8.0* 8.2*  MG  --  1.8 1.7 2.0 1.9  PHOS  --  3.9 3.5 3.3 3.4    ABG: No results for input(s): PHART, PCO2ART, PO2ART, HCO3, O2SAT in the last 168 hours.  Liver Function Tests: Recent Labs  Lab 02/15/19 0226 02/16/19 0631 02/17/19 0500 02/18/19 0714 02/20/19 0743  AST 110*  --   --  42* 38  ALT 367*  --   --  156* 95*  ALKPHOS 171*  --   --  174* 166*  BILITOT 1.6*  --   --  0.9 0.9  PROT 4.7*  --   --  4.8* 5.0*  ALBUMIN 1.4* 1.4*  1.4* 1.7* 1.8*   No results for input(s): LIPASE, AMYLASE in the last 168 hours. No results for input(s): AMMONIA in the last 168 hours.  CBC: Recent Labs  Lab 02/15/19 0226 02/16/19 0631 02/17/19 0500 02/18/19 0714 02/20/19 0743  WBC 6.9 6.6 5.6 6.2 5.6  HGB 7.1* 8.4* 7.6* 8.0* 7.4*  HCT 23.1* 26.6* 24.3* 25.9* 23.7*  MCV 95.9 94.7 96.8 97.4 98.3  PLT 147* 134* 161 170 180    Cardiac Enzymes: No results for input(s): CKTOTAL, CKMB, CKMBINDEX, TROPONINI in the last 168 hours.  BNP (last 3 results) No results for input(s): BNP in the last 8760 hours.  ProBNP (last 3 results) No results for input(s): PROBNP in the last 8760 hours.  Radiological Exams: No results found.  Assessment/Plan Active Problems:   Acute on chronic respiratory failure with hypoxia (HCC)   COVID-19 virus infection   Pneumonia due to COVID-19 virus   Chronic atrial fibrillation (HCC)   Congestive heart failure, NYHA class 3, unspecified failure chronicity, combined (HCC)   1. Acute on chronic respiratory failure with hypoxia we will wean on T collar goal is for 4 hours 2. COVID-19 virus infection clinically resolving 3. Pneumonia due to COVID-19 improving 4. Chronic atrial fibrillation rate controlled 5.  Congestive heart failure baseline we will continue with supportive care   I have personally seen and evaluated the patient, evaluated laboratory and imaging results, formulated the assessment and plan and placed orders. The Patient requires high complexity decision making for assessment and support.  Case was discussed on Rounds with the Respiratory Therapy Staff  Allyne Gee, MD [facility] Pulmonary Critical Care Medicine Sleep Medicine

## 2019-02-21 DIAGNOSIS — I5040 Unspecified combined systolic (congestive) and diastolic (congestive) heart failure: Secondary | ICD-10-CM | POA: Diagnosis not present

## 2019-02-21 DIAGNOSIS — J9621 Acute and chronic respiratory failure with hypoxia: Secondary | ICD-10-CM | POA: Diagnosis not present

## 2019-02-21 DIAGNOSIS — I482 Chronic atrial fibrillation, unspecified: Secondary | ICD-10-CM | POA: Diagnosis not present

## 2019-02-21 DIAGNOSIS — U071 COVID-19: Secondary | ICD-10-CM | POA: Diagnosis not present

## 2019-02-21 NOTE — Progress Notes (Signed)
Pulmonary Critical Care Medicine [facility] [place]   PULMONARY CRITICAL CARE SERVICE  PROGRESS NOTE  Date of Service: 02/21/2019  Mike Butler  WJX:[id number]  DOB: [birth year]   DOA: 02/13/2019  Referring Physician: Carron Curie, MD  HPI: Mike Butler is a 73 y.o. male seen for follow up of Acute on Chronic Respiratory Failure.  Patient was able to complete 4 hours of T collar yesterday today the goal is for 8 hours  Medications: Reviewed on Rounds  Physical Exam:  Vitals: Temperature 97.0 pulse 76 respiratory rate 30 blood pressure 120/80 saturations 95%  Ventilator Settings off the ventilator on T collar  . General: Comfortable at this time . Eyes: Grossly normal lids, irises & conjunctiva . ENT: grossly tongue is normal . Neck: no obvious mass . Cardiovascular: S1 S2 normal no gallop . Respiratory: No rhonchi no rales are noted . Abdomen: soft . Skin: no rash seen on limited exam . Musculoskeletal: not rigid . Psychiatric:unable to assess . Neurologic: no seizure no involuntary movements         Lab Data:   Basic Metabolic Panel: Recent Labs  Lab 02/15/19 0226 02/16/19 0631 02/17/19 0500 02/18/19 0714 02/20/19 0743  NA 136 135 136 136 137  K 2.8* 3.2* 3.0* 3.7 3.6  CL 92* 92* 93* 92* 94*  CO2 34* 31 34* 34* 29  GLUCOSE 65* 162* 144* 168* 184*  BUN 35* 35* 40* 40* 46*  CREATININE 0.71 0.65 0.68 0.69 0.69  CALCIUM 7.5* 7.7* 7.9* 8.0* 8.2*  MG  --  1.8 1.7 2.0 1.9  PHOS  --  3.9 3.5 3.3 3.4    ABG: No results for input(s): PHART, PCO2ART, PO2ART, HCO3, O2SAT in the last 168 hours.  Liver Function Tests: Recent Labs  Lab 02/15/19 0226 02/16/19 0631 02/17/19 0500 02/18/19 0714 02/20/19 0743  AST 110*  --   --  42* 38  ALT 367*  --   --  156* 95*  ALKPHOS 171*  --   --  174* 166*  BILITOT 1.6*  --   --  0.9 0.9  PROT 4.7*  --   --  4.8* 5.0*  ALBUMIN 1.4* 1.4* 1.4* 1.7* 1.8*   No results for input(s): LIPASE, AMYLASE in the  last 168 hours. No results for input(s): AMMONIA in the last 168 hours.  CBC: Recent Labs  Lab 02/15/19 0226 02/16/19 0631 02/17/19 0500 02/18/19 0714 02/20/19 0743  WBC 6.9 6.6 5.6 6.2 5.6  HGB 7.1* 8.4* 7.6* 8.0* 7.4*  HCT 23.1* 26.6* 24.3* 25.9* 23.7*  MCV 95.9 94.7 96.8 97.4 98.3  PLT 147* 134* 161 170 180    Cardiac Enzymes: No results for input(s): CKTOTAL, CKMB, CKMBINDEX, TROPONINI in the last 168 hours.  BNP (last 3 results) No results for input(s): BNP in the last 8760 hours.  ProBNP (last 3 results) No results for input(s): PROBNP in the last 8760 hours.  Radiological Exams: No results found.  Assessment/Plan Active Problems:   Acute on chronic respiratory failure with hypoxia (HCC)   COVID-19 virus infection   Pneumonia due to COVID-19 virus   Chronic atrial fibrillation (HCC)   Congestive heart failure, NYHA class 3, unspecified failure chronicity, combined (HCC)   1. Acute on chronic respiratory failure with hypoxia patient is doing well with weaning on T collar the goal is for 8 hours 2. COVID-19 virus infection in recovery phase 3. Pneumonia treated continue supportive care 4. Chronic atrial fibrillation rate controlled 5. Congestive heart failure compensated  at this time   I have personally seen and evaluated the patient, evaluated laboratory and imaging results, formulated the assessment and plan and placed orders. The Patient requires high complexity decision making for assessment and support.  Case was discussed on Rounds with the Respiratory Therapy Staff  Allyne Gee, MD [facility] Pulmonary Critical Care Medicine Sleep Medicine

## 2019-02-22 DIAGNOSIS — J9621 Acute and chronic respiratory failure with hypoxia: Secondary | ICD-10-CM | POA: Diagnosis not present

## 2019-02-22 DIAGNOSIS — U071 COVID-19: Secondary | ICD-10-CM | POA: Diagnosis not present

## 2019-02-22 DIAGNOSIS — I5040 Unspecified combined systolic (congestive) and diastolic (congestive) heart failure: Secondary | ICD-10-CM | POA: Diagnosis not present

## 2019-02-22 DIAGNOSIS — I482 Chronic atrial fibrillation, unspecified: Secondary | ICD-10-CM | POA: Diagnosis not present

## 2019-02-22 LAB — BASIC METABOLIC PANEL
Anion gap: 9 (ref 5–15)
BUN: 55 mg/dL — ABNORMAL HIGH (ref 8–23)
CO2: 36 mmol/L — ABNORMAL HIGH (ref 22–32)
Calcium: 7.8 mg/dL — ABNORMAL LOW (ref 8.9–10.3)
Chloride: 97 mmol/L — ABNORMAL LOW (ref 98–111)
Creatinine, Ser: 0.7 mg/dL (ref 0.61–1.24)
GFR calc Af Amer: >60 mL/min (ref >60)
GFR calc non Af Amer: >60 mL/min (ref >60)
Glucose, Bld: 169 mg/dL — ABNORMAL HIGH (ref 70–99)
Potassium: 3 mmol/L — ABNORMAL LOW (ref 3.5–5.1)
Sodium: 142 mmol/L (ref 135–145)

## 2019-02-22 LAB — CBC
HCT: 24.7 % — ABNORMAL LOW (ref 39.0–52.0)
Hemoglobin: 7.4 g/dL — ABNORMAL LOW (ref 13.0–17.0)
MCH: 30.2 pg (ref 26.0–34.0)
MCHC: 30 g/dL (ref 30.0–36.0)
MCV: 100.8 fL — ABNORMAL HIGH (ref 80.0–100.0)
Platelets: 240 10*3/uL (ref 150–400)
RBC: 2.45 MIL/uL — ABNORMAL LOW (ref 4.22–5.81)
RDW: 23.2 % — ABNORMAL HIGH (ref 11.5–15.5)
WBC: 7.7 10*3/uL (ref 4.0–10.5)
nRBC: 1.3 % — ABNORMAL HIGH (ref 0.0–0.2)

## 2019-02-22 LAB — MAGNESIUM: Magnesium: 1.8 mg/dL (ref 1.7–2.4)

## 2019-02-22 NOTE — Progress Notes (Signed)
Pulmonary Critical Care Medicine Mike Butler   PULMONARY CRITICAL CARE SERVICE  PROGRESS NOTE  Date of Service: 02/22/2019  Mike Butler  [id number]  DOB: 06-09-1945   DOA: 02/03/2019  Referring Physician: Merton Border, MD  HPI: Mike Butler is a 73 y.o. male seen for follow up of Acute on Chronic Respiratory Failure.  Patient was able to only do 1.5 hours of T collar had to be placed back on the ventilator   Medications: Reviewed on Rounds  Physical Exam:  Vitals: Temperature 98.1 pulse 70 respiratory rate 30 blood pressure 94/50 saturations 98%  Ventilator Settings rate of ventilation pressure great FiO2 50% pressure is 26 5 tidal line 3 degrees  . General: Comfortable at this time . Eyes: Grossly normal lids, irises & conjunctiva . ENT: grossly tongue is normal . Neck: no obvious mass . Cardiovascular: S1 S2 normal no gallop . Respiratory: No rhonchi no rales are noted . Abdomen: soft . Skin: no rash seen on limited exam . Musculoskeletal: not rigid . Psychiatric:unable to assess . Neurologic: no seizure no involuntary movements         Lab Data:   Basic Metabolic Panel: Recent Labs  Lab 02/16/19 0631 02/17/19 0500 02/18/19 0714 02/20/19 0743 02/22/19 0948  NA 135 136 136 137 142  K 3.2* 3.0* 3.7 3.6 3.0*  CL 92* 93* 92* 94* 97*  CO2 31 34* 34* 29 36*  GLUCOSE 162* 144* 168* 184* 169*  BUN 35* 40* 40* 46* 55*  CREATININE 0.65 0.68 0.69 0.69 0.70  CALCIUM 7.7* 7.9* 8.0* 8.2* 7.8*  MG 1.8 1.7 2.0 1.9 1.8  PHOS 3.9 3.5 3.3 3.4  --     ABG: No results for input(s): PHART, PCO2ART, PO2ART, HCO3, O2SAT in the last 168 hours.  Liver Function Tests: Recent Labs  Lab 02/16/19 0631 02/17/19 0500 02/18/19 0714 02/20/19 0743  AST  --   --  42* 38  ALT  --   --  156* 95*  ALKPHOS  --   --  174* 166*  BILITOT  --   --  0.9 0.9  PROT  --   --  4.8* 5.0*  ALBUMIN 1.4* 1.4* 1.7* 1.8*   No results for input(s): LIPASE, AMYLASE in  the last 168 hours. No results for input(s): AMMONIA in the last 168 hours.  CBC: Recent Labs  Lab 02/16/19 0631 02/17/19 0500 02/18/19 0714 02/20/19 0743 02/22/19 1039  WBC 6.6 5.6 6.2 5.6 7.7  HGB 8.4* 7.6* 8.0* 7.4* 7.4*  HCT 26.6* 24.3* 25.9* 23.7* 24.7*  MCV 94.7 96.8 97.4 98.3 100.8*  PLT 134* 161 170 180 240    Cardiac Enzymes: No results for input(s): CKTOTAL, CKMB, CKMBINDEX, TROPONINI in the last 168 hours.  BNP (last 3 results) No results for input(s): BNP in the last 8760 hours.  ProBNP (last 3 results) No results for input(s): PROBNP in the last 8760 hours.  Radiological Exams: No results found.  Assessment/Plan Active Problems:   Acute on chronic respiratory failure with hypoxia (HCC)   COVID-19 virus infection   Pneumonia due to COVID-19 virus   Chronic atrial fibrillation (HCC)   Congestive heart failure, NYHA class 3, unspecified failure chronicity, combined ([place])   1. Acute on chronic respiratory failure with hypoxia patient is on pressure support FiO2 50% pressure 12/5 standing 139. 2. COVID-19 virus infection.  Continue supportive. 3. Pneumonia treated with supportive care asymptomatic with the formulation of a continue 4. Congestive heart failure class III 5.  Continue monitor fluid status   I have personally seen and evaluated the patient, evaluated laboratory and imaging results, formulated the assessment and plan and placed orders. The Patient requires high complexity decision making for assessment and support.  Case was discussed on Rounds with the Respiratory Therapy Staff  Yevonne Pax, MD [facility] Pulmonary Critical Care Medicine Sleep Medicine

## 2019-02-23 ENCOUNTER — Other Ambulatory Visit (HOSPITAL_COMMUNITY): Payer: Medicare HMO

## 2019-02-23 DIAGNOSIS — J9621 Acute and chronic respiratory failure with hypoxia: Secondary | ICD-10-CM | POA: Diagnosis not present

## 2019-02-23 DIAGNOSIS — I482 Chronic atrial fibrillation, unspecified: Secondary | ICD-10-CM | POA: Diagnosis not present

## 2019-02-23 DIAGNOSIS — I5040 Unspecified combined systolic (congestive) and diastolic (congestive) heart failure: Secondary | ICD-10-CM | POA: Diagnosis not present

## 2019-02-23 DIAGNOSIS — U071 COVID-19: Secondary | ICD-10-CM | POA: Diagnosis not present

## 2019-02-23 LAB — BASIC METABOLIC PANEL
Anion gap: 14 (ref 5–15)
BUN: 60 mg/dL — ABNORMAL HIGH (ref 8–23)
CO2: 32 mmol/L (ref 22–32)
Calcium: 7.8 mg/dL — ABNORMAL LOW (ref 8.9–10.3)
Chloride: 98 mmol/L (ref 98–111)
Creatinine, Ser: 0.81 mg/dL (ref 0.61–1.24)
GFR calc Af Amer: >60 mL/min (ref >60)
GFR calc non Af Amer: >60 mL/min (ref >60)
Glucose, Bld: 127 mg/dL — ABNORMAL HIGH (ref 70–99)
Potassium: 4.4 mmol/L (ref 3.5–5.1)
Sodium: 144 mmol/L (ref 135–145)

## 2019-02-23 NOTE — Progress Notes (Signed)
Pulmonary Critical Care Medicine [place]   PULMONARY CRITICAL CARE SERVICE  PROGRESS NOTE  Date of Service: 02/23/2019  Mike Butler  [id number]:[id number]  DOB: [birth year]   DOA: 02/08/2019  Referring Physician: Merton Border, MD  HPI: Mike Butler is a 73 y.o. male seen for follow up of Acute on Chronic Respiratory Failure.  Patient has been on assist control right now is supposed to wean on T collar with a goal of 8 hours possibly today  Medications: Reviewed on Rounds  Physical Exam:  Vitals: Temperature 98.3 pulse 76 respiratory 24 blood pressure 117/48 saturations 95%  Ventilator Settings mode ventilation assist control FiO2 50% tidal volume 478 PEEP 5  . General: Comfortable at this time . Eyes: Grossly normal lids, irises & conjunctiva . ENT: grossly tongue is normal . Neck: no obvious mass . Cardiovascular: S1 S2 normal no gallop . Respiratory: No rhonchi no rales are noted at this time . Abdomen: soft . Skin: no rash seen on limited exam . Musculoskeletal: not rigid . Psychiatric:unable to assess . Neurologic: no seizure no involuntary movements         Lab Data:   Basic Metabolic Panel: Recent Labs  Lab 02/17/19 0500 02/18/19 0714 02/20/19 0743 02/22/19 0948 02/23/19 0445  NA 136 136 137 142 144  K 3.0* 3.7 3.6 3.0* 4.4  CL 93* 92* 94* 97* 98  CO2 34* 34* 29 36* 32  GLUCOSE 144* 168* 184* 169* 127*  BUN 40* 40* 46* 55* 60*  CREATININE 0.68 0.69 0.69 0.70 0.81  CALCIUM 7.9* 8.0* 8.2* 7.8* 7.8*  MG 1.7 2.0 1.9 1.8  --   PHOS 3.5 3.3 3.4  --   --     ABG: No results for input(s): PHART, PCO2ART, PO2ART, HCO3, O2SAT in the last 168 hours.  Liver Function Tests: Recent Labs  Lab 02/17/19 0500 02/18/19 0714 02/20/19 0743  AST  --  42* 38  ALT  --  156* 95*  ALKPHOS  --  174* 166*  BILITOT  --  0.9 0.9  PROT  --  4.8* 5.0*  ALBUMIN 1.4* 1.7* 1.8*   No results for input(s): LIPASE, AMYLASE in the last 168 hours. No  results for input(s): AMMONIA in the last 168 hours.  CBC: Recent Labs  Lab 02/17/19 0500 02/18/19 0714 02/20/19 0743 02/22/19 1039  WBC 5.6 6.2 5.6 7.7  HGB 7.6* 8.0* 7.4* 7.4*  HCT 24.3* 25.9* 23.7* 24.7*  MCV 96.8 97.4 98.3 100.8*  PLT 161 170 180 240    Cardiac Enzymes: No results for input(s): CKTOTAL, CKMB, CKMBINDEX, TROPONINI in the last 168 hours.  BNP (last 3 results) No results for input(s): BNP in the last 8760 hours.  ProBNP (last 3 results) No results for input(s): PROBNP in the last 8760 hours.  Radiological Exams: Dg Chest Port 1 View  Result Date: 02/23/2019 CLINICAL DATA:  Pulmonary edema.  Tracheostomy. EXAM: PORTABLE CHEST 1 VIEW COMPARISON:  01/23/2019 FINDINGS: Tracheostomy in good position. NG tube in the stomach. Pacemaker leads unchanged. Postop CABG. Diffuse bilateral airspace disease with mild progression. Progression of small right pleural effusion. Progression of bibasilar atelectasis. No pneumothorax. IMPRESSION: Mild progression of pulmonary edema and small right effusion. Progression of bibasilar atelectasis. Electronically Signed   By: Franchot Gallo M.D.   On: 02/23/2019 06:56    Assessment/Plan Active Problems:   Acute on chronic respiratory failure with hypoxia ([place])   COVID-19 virus infection   Pneumonia due to COVID-19 virus  Chronic atrial fibrillation (HCC)   Congestive heart failure, NYHA class 3, unspecified failure chronicity, combined (HCC)   1. Acute on chronic respiratory failure with hypoxia plan is to continue to advance the wean as mentioned today the goal is for 8 hours on T collar 2. COVID-19 virus infection in recovery phase 3. Pneumonia due to COVID-19 treated 4. Chronic atrial fibrillation rate is controlled we will continue to follow 5. Congestive heart failure patient is at baseline   I have personally seen and evaluated the patient, evaluated laboratory and imaging results, formulated the assessment and plan and  placed orders. The Patient requires high complexity decision making for assessment and support.  Case was discussed on Rounds with the Respiratory Therapy Staff  Yevonne Pax, MD [facility] Pulmonary Critical Care Medicine Sleep Medicine

## 2019-02-24 ENCOUNTER — Other Ambulatory Visit (HOSPITAL_COMMUNITY): Payer: Medicare HMO

## 2019-02-24 DIAGNOSIS — I482 Chronic atrial fibrillation, unspecified: Secondary | ICD-10-CM | POA: Diagnosis not present

## 2019-02-24 DIAGNOSIS — U071 COVID-19: Secondary | ICD-10-CM | POA: Diagnosis not present

## 2019-02-24 DIAGNOSIS — J9621 Acute and chronic respiratory failure with hypoxia: Secondary | ICD-10-CM | POA: Diagnosis not present

## 2019-02-24 DIAGNOSIS — I5040 Unspecified combined systolic (congestive) and diastolic (congestive) heart failure: Secondary | ICD-10-CM | POA: Diagnosis not present

## 2019-02-24 LAB — BASIC METABOLIC PANEL
Anion gap: 12 (ref 5–15)
BUN: 66 mg/dL — ABNORMAL HIGH (ref 8–23)
CO2: 34 mmol/L — ABNORMAL HIGH (ref 22–32)
Calcium: 8.3 mg/dL — ABNORMAL LOW (ref 8.9–10.3)
Chloride: 96 mmol/L — ABNORMAL LOW (ref 98–111)
Creatinine, Ser: 0.81 mg/dL (ref 0.61–1.24)
GFR calc Af Amer: >60 mL/min (ref >60)
GFR calc non Af Amer: >60 mL/min (ref >60)
Glucose, Bld: 172 mg/dL — ABNORMAL HIGH (ref 70–99)
Potassium: 4 mmol/L (ref 3.5–5.1)
Sodium: 142 mmol/L (ref 135–145)

## 2019-02-24 NOTE — Progress Notes (Addendum)
Pulmonary Critical Care Medicine Mike Butler [place]   PULMONARY CRITICAL CARE SERVICE  PROGRESS NOTE  Date of Service: 02/24/2019  [name]  VQM:[id number]  DOB: [birth year]   DOA: 10-Mar-2019  Referring Physician: Carron Curie, MD  HPI: Mike Butler is a 73 y.o. male seen for follow up of Acute on Chronic Respiratory Failure.  Patient has a 12-hour goal today on 50% aerosol trach collar.  Was unable to do this and only lasted 25 minutes before desatting down below 84%.  Placed back on pressure support 12/7 FiO2 45% satting well this time.  Medications: Reviewed on Rounds  Physical Exam:  Vitals: Pulse 76 respirations 29 BP 118/59 O2 sat 95% temp 98.6  Ventilator Settings pressure support 12/7 with an FiO2 of 45%  . General: Comfortable at this time . Eyes: Grossly normal lids, irises & conjunctiva . ENT: grossly tongue is normal . Neck: no obvious mass . Cardiovascular: S1 S2 normal no gallop . Respiratory: No rales or rhonchi noted . Abdomen: soft . Skin: no rash seen on limited exam . Musculoskeletal: not rigid . Psychiatric:unable to assess . Neurologic: no seizure no involuntary movements         Lab Data:   Basic Metabolic Panel: Recent Labs  Lab 02/18/19 0714 02/20/19 0743 02/22/19 0948 02/23/19 0445 02/24/19 0605  NA 136 137 142 144 142  K 3.7 3.6 3.0* 4.4 4.0  CL 92* 94* 97* 98 96*  CO2 34* 29 36* 32 34*  GLUCOSE 168* 184* 169* 127* 172*  BUN 40* 46* 55* 60* 66*  CREATININE 0.69 0.69 0.70 0.81 0.81  CALCIUM 8.0* 8.2* 7.8* 7.8* 8.3*  MG 2.0 1.9 1.8  --   --   PHOS 3.3 3.4  --   --   --     ABG: No results for input(s): PHART, PCO2ART, PO2ART, HCO3, O2SAT in the last 168 hours.  Liver Function Tests: Recent Labs  Lab 02/18/19 0714 02/20/19 0743  AST 42* 38  ALT 156* 95*  ALKPHOS 174* 166*  BILITOT 0.9 0.9  PROT 4.8* 5.0*  ALBUMIN 1.7* 1.8*   No results for input(s): LIPASE, AMYLASE in the last 168 hours. No results for  input(s): AMMONIA in the last 168 hours.  CBC: Recent Labs  Lab 02/18/19 0714 02/20/19 0743 02/22/19 1039  WBC 6.2 5.6 7.7  HGB 8.0* 7.4* 7.4*  HCT 25.9* 23.7* 24.7*  MCV 97.4 98.3 100.8*  PLT 170 180 240    Cardiac Enzymes: No results for input(s): CKTOTAL, CKMB, CKMBINDEX, TROPONINI in the last 168 hours.  BNP (last 3 results) No results for input(s): BNP in the last 8760 hours.  ProBNP (last 3 results) No results for input(s): PROBNP in the last 8760 hours.  Radiological Exams: Ct Abdomen Wo Contrast  Result Date: 02/24/2019 CLINICAL DATA:  Preop planning for gastrostomy catheter placement EXAM: CT ABDOMEN WITHOUT CONTRAST TECHNIQUE: Multidetector CT imaging of the abdomen was performed following the standard protocol without IV contrast. COMPARISON:  None. FINDINGS: Lower chest: Bilateral pleural effusions. Coronary calcifications. Transvenous pacing leads partially visualized. Hepatobiliary: No focal liver abnormality is seen. Gallbladder is decompressed or absent. No biliary dilatation. Pancreas: Unremarkable. No pancreatic ductal dilatation or surrounding inflammatory changes. Spleen: Normal in size without focal abnormality. Small accessory splenule. Adrenals/Urinary Tract: Normal adrenals. Bilateral low-attenuation renal lesions largest 5.4 cm lower right, probably cysts but incompletely characterized. No urolithiasis or hydronephrosis. Stomach/Bowel: Nasogastric tube into the decompressed stomach. There is safe percutaneous window for gastrostomy placement. Visualized  portions of small bowel and colon are decompressed, unremarkable. Vascular/Lymphatic: Calcified aortic plaque. Circumaortic left renal vein, an anatomic variant. No adenopathy localized. Other: No ascites. No free air. Musculoskeletal: Sternotomy defect and wires. Instrumented fusion L3-L5. Bridging osteophytes throughout the visualized lower thoracic and lumbar spine. no fracture or worrisome bone lesion.  IMPRESSION: 1. There is safe percutaneous window for gastrostomy placement. 2. Bilateral pleural effusions. 3. Coronary and Aortic Atherosclerosis (ICD10-I70.0). Electronically Signed   By: Lucrezia Europe M.D.   On: 02/24/2019 07:22   Dg Chest Port 1 View  Result Date: 02/23/2019 CLINICAL DATA:  Pulmonary edema.  Tracheostomy. EXAM: PORTABLE CHEST 1 VIEW COMPARISON:  03-16-2019 FINDINGS: Tracheostomy in good position. NG tube in the stomach. Pacemaker leads unchanged. Postop CABG. Diffuse bilateral airspace disease with mild progression. Progression of small right pleural effusion. Progression of bibasilar atelectasis. No pneumothorax. IMPRESSION: Mild progression of pulmonary edema and small right effusion. Progression of bibasilar atelectasis. Electronically Signed   By: Franchot Gallo M.D.   On: 02/23/2019 06:56    Assessment/Plan Active Problems:   Acute on chronic respiratory failure with hypoxia (HCC)   COVID-19 virus infection   Pneumonia due to COVID-19 virus   Chronic atrial fibrillation (HCC)   Congestive heart failure, NYHA class 3, unspecified failure chronicity, combined ([place])   1. Acute on chronic respiratory failure with hypoxia patient failed wean today and remains on pressure support at this time.  Continue aggressive pulmonary toilet supportive measures and continue to attempt weaning per protocol. 2. COVID-19 virus infection in recovery phase 3. Pneumonia due to COVID-19 treated 4. Chronic atrial fibrillation rate is controlled we will continue to follow 5. Congestive heart failure patient is at baseline   I have personally seen and evaluated the patient, evaluated laboratory and imaging results, formulated the assessment and plan and placed orders. The Patient requires high complexity decision making for assessment and support.  Case was discussed on Rounds with the Respiratory Therapy Staff  Allyne Gee, MD [facility] Pulmonary Critical Care Medicine Sleep Medicine

## 2019-02-25 DIAGNOSIS — I482 Chronic atrial fibrillation, unspecified: Secondary | ICD-10-CM | POA: Diagnosis not present

## 2019-02-25 DIAGNOSIS — I5040 Unspecified combined systolic (congestive) and diastolic (congestive) heart failure: Secondary | ICD-10-CM | POA: Diagnosis not present

## 2019-02-25 DIAGNOSIS — J9621 Acute and chronic respiratory failure with hypoxia: Secondary | ICD-10-CM | POA: Diagnosis not present

## 2019-02-25 DIAGNOSIS — U071 COVID-19: Secondary | ICD-10-CM | POA: Diagnosis not present

## 2019-02-25 LAB — COMPREHENSIVE METABOLIC PANEL
ALT: 56 U/L — ABNORMAL HIGH (ref 0–44)
AST: 26 U/L (ref 15–41)
Albumin: 1.4 g/dL — ABNORMAL LOW (ref 3.5–5.0)
Alkaline Phosphatase: 189 U/L — ABNORMAL HIGH (ref 38–126)
Anion gap: 11 (ref 5–15)
BUN: 68 mg/dL — ABNORMAL HIGH (ref 8–23)
CO2: 37 mmol/L — ABNORMAL HIGH (ref 22–32)
Calcium: 8.5 mg/dL — ABNORMAL LOW (ref 8.9–10.3)
Chloride: 94 mmol/L — ABNORMAL LOW (ref 98–111)
Creatinine, Ser: 0.88 mg/dL (ref 0.61–1.24)
GFR calc Af Amer: >60 mL/min (ref >60)
GFR calc non Af Amer: >60 mL/min (ref >60)
Glucose, Bld: 221 mg/dL — ABNORMAL HIGH (ref 70–99)
Potassium: 3.5 mmol/L (ref 3.5–5.1)
Sodium: 142 mmol/L (ref 135–145)
Total Bilirubin: 1.5 mg/dL — ABNORMAL HIGH (ref 0.3–1.2)
Total Protein: 5.7 g/dL — ABNORMAL LOW (ref 6.5–8.1)

## 2019-02-25 NOTE — Progress Notes (Addendum)
Pulmonary Critical Care Medicine [facility] [place]   PULMONARY CRITICAL CARE SERVICE  PROGRESS NOTE  Date of Service: 02/25/2019  Mike Butler  GEX:[id number]  DOB: Aug 02, 1945   DOA: 01/27/2019  Referring Physician: Carron Curie, MD  HPI: Mike Butler is a 73 y.o. male seen for follow up of Acute on Chronic Respiratory Failure.  Patient was able to tolerate 1 hour on aerosol trach collar 50% FiO2 today before tachypnea became unbearable.  Patient was placed back on pressure support however failed to return to baseline and is now back on full support on the ventilator.  Medications: Reviewed on Rounds  Physical Exam:  Vitals: Pulse 87 respirations 27 BP 95/45 O2 sat 96% temp 97 4  Ventilator Settings AC VC rate 18 tidal volume 450 PEEP of 5 FiO2 50%  . General: Comfortable at this time . Eyes: Grossly normal lids, irises & conjunctiva . ENT: grossly tongue is normal . Neck: no obvious mass . Cardiovascular: S1 S2 normal no gallop . Respiratory: No rales or rhonchi noted . Abdomen: soft . Skin: no rash seen on limited exam . Musculoskeletal: not rigid . Psychiatric:unable to assess . Neurologic: no seizure no involuntary movements         Lab Data:   Basic Metabolic Panel: Recent Labs  Lab 02/20/19 0743 02/22/19 0948 02/23/19 0445 02/24/19 0605 02/25/19 1309  NA 137 142 144 142 142  K 3.6 3.0* 4.4 4.0 3.5  CL 94* 97* 98 96* 94*  CO2 29 36* 32 34* 37*  GLUCOSE 184* 169* 127* 172* 221*  BUN 46* 55* 60* 66* 68*  CREATININE 0.69 0.70 0.81 0.81 0.88  CALCIUM 8.2* 7.8* 7.8* 8.3* 8.5*  MG 1.9 1.8  --   --   --   PHOS 3.4  --   --   --   --     ABG: No results for input(s): PHART, PCO2ART, PO2ART, HCO3, O2SAT in the last 168 hours.  Liver Function Tests: Recent Labs  Lab 02/20/19 0743 02/25/19 1309  AST 38 26  ALT 95* 56*  ALKPHOS 166* 189*  BILITOT 0.9 1.5*  PROT 5.0* 5.7*  ALBUMIN 1.8* 1.4*   No results for input(s): LIPASE, AMYLASE in  the last 168 hours. No results for input(s): AMMONIA in the last 168 hours.  CBC: Recent Labs  Lab 02/20/19 0743 02/22/19 1039  WBC 5.6 7.7  HGB 7.4* 7.4*  HCT 23.7* 24.7*  MCV 98.3 100.8*  PLT 180 240    Cardiac Enzymes: No results for input(s): CKTOTAL, CKMB, CKMBINDEX, TROPONINI in the last 168 hours.  BNP (last 3 results) No results for input(s): BNP in the last 8760 hours.  ProBNP (last 3 results) No results for input(s): PROBNP in the last 8760 hours.  Radiological Exams: Ct Abdomen Wo Contrast  Result Date: 02/24/2019 CLINICAL DATA:  Preop planning for gastrostomy catheter placement EXAM: CT ABDOMEN WITHOUT CONTRAST TECHNIQUE: Multidetector CT imaging of the abdomen was performed following the standard protocol without IV contrast. COMPARISON:  None. FINDINGS: Lower chest: Bilateral pleural effusions. Coronary calcifications. Transvenous pacing leads partially visualized. Hepatobiliary: No focal liver abnormality is seen. Gallbladder is decompressed or absent. No biliary dilatation. Pancreas: Unremarkable. No pancreatic ductal dilatation or surrounding inflammatory changes. Spleen: Normal in size without focal abnormality. Small accessory splenule. Adrenals/Urinary Tract: Normal adrenals. Bilateral low-attenuation renal lesions largest 5.4 cm lower right, probably cysts but incompletely characterized. No urolithiasis or hydronephrosis. Stomach/Bowel: Nasogastric tube into the decompressed stomach. There is safe percutaneous window  for gastrostomy placement. Visualized portions of small bowel and colon are decompressed, unremarkable. Vascular/Lymphatic: Calcified aortic plaque. Circumaortic left renal vein, an anatomic variant. No adenopathy localized. Other: No ascites. No free air. Musculoskeletal: Sternotomy defect and wires. Instrumented fusion L3-L5. Bridging osteophytes throughout the visualized lower thoracic and lumbar spine. no fracture or worrisome bone lesion. IMPRESSION:  1. There is safe percutaneous window for gastrostomy placement. 2. Bilateral pleural effusions. 3. Coronary and Aortic Atherosclerosis (ICD10-I70.0). Electronically Signed   By: Lucrezia Europe M.D.   On: 02/24/2019 07:22    Assessment/Plan Active Problems:   Acute on chronic respiratory failure with hypoxia (HCC)   COVID-19 virus infection   Pneumonia due to COVID-19 virus   Chronic atrial fibrillation (HCC)   Congestive heart failure, NYHA class 3, unspecified failure chronicity, combined ([place])   1. Acute on chronic respiratory failure with hypoxia patient is back on full support on the ventilator at this time we will continue to attempt weaning as tolerated. 2. COVID-19 virus infection in recovery phase 3. Pneumonia due to COVID-19 treated 4. Chronic atrial fibrillation rate is controlled we will continue to follow 5. Congestive heart failure patient is at baseline   I have personally seen and evaluated the patient, evaluated laboratory and imaging results, formulated the assessment and plan and placed orders. The Patient requires high complexity decision making for assessment and support.  Case was discussed on Rounds with the Respiratory Therapy Staff  Allyne Gee, MD [facility] Pulmonary Critical Care Medicine Sleep Medicine

## 2019-02-26 ENCOUNTER — Other Ambulatory Visit (HOSPITAL_COMMUNITY): Payer: Medicare HMO

## 2019-02-26 DIAGNOSIS — J9621 Acute and chronic respiratory failure with hypoxia: Secondary | ICD-10-CM | POA: Diagnosis not present

## 2019-02-26 DIAGNOSIS — I5040 Unspecified combined systolic (congestive) and diastolic (congestive) heart failure: Secondary | ICD-10-CM | POA: Diagnosis not present

## 2019-02-26 DIAGNOSIS — I482 Chronic atrial fibrillation, unspecified: Secondary | ICD-10-CM | POA: Diagnosis not present

## 2019-02-26 DIAGNOSIS — U071 COVID-19: Secondary | ICD-10-CM | POA: Diagnosis not present

## 2019-02-26 LAB — CBC
HCT: 27.5 % — ABNORMAL LOW (ref 39.0–52.0)
Hemoglobin: 8 g/dL — ABNORMAL LOW (ref 13.0–17.0)
MCH: 30.4 pg (ref 26.0–34.0)
MCHC: 29.1 g/dL — ABNORMAL LOW (ref 30.0–36.0)
MCV: 104.6 fL — ABNORMAL HIGH (ref 80.0–100.0)
Platelets: 319 10*3/uL (ref 150–400)
RBC: 2.63 MIL/uL — ABNORMAL LOW (ref 4.22–5.81)
RDW: 24.3 % — ABNORMAL HIGH (ref 11.5–15.5)
WBC: 10.1 10*3/uL (ref 4.0–10.5)
nRBC: 3.4 % — ABNORMAL HIGH (ref 0.0–0.2)

## 2019-02-26 LAB — COMPREHENSIVE METABOLIC PANEL
ALT: 51 U/L — ABNORMAL HIGH (ref 0–44)
AST: 34 U/L (ref 15–41)
Albumin: 1.4 g/dL — ABNORMAL LOW (ref 3.5–5.0)
Alkaline Phosphatase: 219 U/L — ABNORMAL HIGH (ref 38–126)
Anion gap: 10 (ref 5–15)
BUN: 72 mg/dL — ABNORMAL HIGH (ref 8–23)
CO2: 38 mmol/L — ABNORMAL HIGH (ref 22–32)
Calcium: 8.3 mg/dL — ABNORMAL LOW (ref 8.9–10.3)
Chloride: 96 mmol/L — ABNORMAL LOW (ref 98–111)
Creatinine, Ser: 0.82 mg/dL (ref 0.61–1.24)
GFR calc Af Amer: >60 mL/min (ref >60)
GFR calc non Af Amer: >60 mL/min (ref >60)
Glucose, Bld: 185 mg/dL — ABNORMAL HIGH (ref 70–99)
Potassium: 3.6 mmol/L (ref 3.5–5.1)
Sodium: 144 mmol/L (ref 135–145)
Total Bilirubin: 1.3 mg/dL — ABNORMAL HIGH (ref 0.3–1.2)
Total Protein: 5.6 g/dL — ABNORMAL LOW (ref 6.5–8.1)

## 2019-02-26 LAB — MAGNESIUM: Magnesium: 2.3 mg/dL (ref 1.7–2.4)

## 2019-02-26 NOTE — Progress Notes (Addendum)
Pulmonary Critical Care Medicine [place]   PULMONARY CRITICAL CARE SERVICE  PROGRESS NOTE  Date of Service: 02/26/2019  Mike Butler  PJA:[id number]  DOB: 04/22/1945   DOA: 02/13/2019  Referring Physician: Merton Border, MD  HPI: Mike Butler is a 73 y.o. male seen for follow up of Acute on Chronic Respiratory Failure.  Patient remains full support on the ventilator at this time.  Patient failed SBT this morning so no weaning is progressing.  Medications: Reviewed on Rounds  Physical Exam:  Vitals: Pulse 86 respirations 18 BP 117/57 O2 sat 92% temp 97.6  Ventilator Settings ventilator mode AC VC rate of 18 tidal volume 450 PEEP of 5 FiO2 50%  . General: Comfortable at this time . Eyes: Grossly normal lids, irises & conjunctiva . ENT: grossly tongue is normal . Neck: no obvious mass . Cardiovascular: S1 S2 normal no gallop . Respiratory: No rales or rhonchi noted . Abdomen: soft . Skin: no rash seen on limited exam . Musculoskeletal: not rigid . Psychiatric:unable to assess . Neurologic: no seizure no involuntary movements         Lab Data:   Basic Metabolic Panel: Recent Labs  Lab 02/20/19 0743 02/22/19 0948 02/23/19 0445 02/24/19 0605 02/25/19 1309 02/26/19 0504  NA 137 142 144 142 142 144  K 3.6 3.0* 4.4 4.0 3.5 3.6  CL 94* 97* 98 96* 94* 96*  CO2 29 36* 32 34* 37* 38*  GLUCOSE 184* 169* 127* 172* 221* 185*  BUN 46* 55* 60* 66* 68* 72*  CREATININE 0.69 0.70 0.81 0.81 0.88 0.82  CALCIUM 8.2* 7.8* 7.8* 8.3* 8.5* 8.3*  MG 1.9 1.8  --   --   --  2.3  PHOS 3.4  --   --   --   --   --     ABG: No results for input(s): PHART, PCO2ART, PO2ART, HCO3, O2SAT in the last 168 hours.  Liver Function Tests: Recent Labs  Lab 02/20/19 0743 02/25/19 1309 02/26/19 0504  AST 38 26 34  ALT 95* 56* 51*  ALKPHOS 166* 189* 219*  BILITOT 0.9 1.5* 1.3*  PROT 5.0* 5.7* 5.6*  ALBUMIN 1.8* 1.4* 1.4*   No results for input(s): LIPASE, AMYLASE  in the last 168 hours. No results for input(s): AMMONIA in the last 168 hours.  CBC: Recent Labs  Lab 02/20/19 0743 02/22/19 1039 02/26/19 0504  WBC 5.6 7.7 10.1  HGB 7.4* 7.4* 8.0*  HCT 23.7* 24.7* 27.5*  MCV 98.3 100.8* 104.6*  PLT 180 240 319    Cardiac Enzymes: No results for input(s): CKTOTAL, CKMB, CKMBINDEX, TROPONINI in the last 168 hours.  BNP (last 3 results) No results for input(s): BNP in the last 8760 hours.  ProBNP (last 3 results) No results for input(s): PROBNP in the last 8760 hours.  Radiological Exams: Dg Chest Port 1 View  Result Date: 02/26/2019 CLINICAL DATA:  Pulmonary edema. EXAM: PORTABLE CHEST 1 VIEW COMPARISON:  Chest radiograph 02/23/2019 FINDINGS: Monitoring leads overlie the patient. Enteric tube courses inferior to the diaphragm. Stable cardiomegaly status post median sternotomy. Multi lead pacer apparatus overlies the left hemithorax. Persistent small left pleural effusion and underlying consolidation. Similar-appearing bilateral interstitial pulmonary opacities. No pneumothorax. IMPRESSION: Cardiomegaly with findings suggestive of interstitial pulmonary edema. Superimposed infection not excluded. Small left pleural effusion and underlying consolidation. Electronically Signed   By: Lovey Newcomer M.D.   On: 02/26/2019 09:00    Assessment/Plan Active Problems:   Acute on chronic respiratory failure  with hypoxia (HCC)   COVID-19 virus infection   Pneumonia due to COVID-19 virus   Chronic atrial fibrillation (HCC)   Congestive heart failure, NYHA class 3, unspecified failure chronicity, combined (HCC)   1. Acute on chronic respiratory failure with hypoxia patient is back on full support on the ventilator at this time we will continue to attempt weaning as tolerated. 2. COVID-19 virus infection in recovery phase 3. Pneumonia due to COVID-19 treated 4. Chronic atrial fibrillation rate is controlled  5. Congestive heart failure patient is at  baseline   I have personally seen and evaluated the patient, evaluated laboratory and imaging results, formulated the assessment and plan and placed orders. The Patient requires high complexity decision making for assessment and support.  Case was discussed on Rounds with the Respiratory Therapy Staff  Yevonne Pax, MD [facility] Pulmonary Critical Care Medicine Sleep Medicine

## 2019-02-27 DIAGNOSIS — I5040 Unspecified combined systolic (congestive) and diastolic (congestive) heart failure: Secondary | ICD-10-CM | POA: Diagnosis not present

## 2019-02-27 DIAGNOSIS — J9621 Acute and chronic respiratory failure with hypoxia: Secondary | ICD-10-CM | POA: Diagnosis not present

## 2019-02-27 DIAGNOSIS — I482 Chronic atrial fibrillation, unspecified: Secondary | ICD-10-CM | POA: Diagnosis not present

## 2019-02-27 DIAGNOSIS — U071 COVID-19: Secondary | ICD-10-CM | POA: Diagnosis not present

## 2019-02-27 LAB — BASIC METABOLIC PANEL
Anion gap: 13 (ref 5–15)
BUN: 68 mg/dL — ABNORMAL HIGH (ref 8–23)
CO2: 34 mmol/L — ABNORMAL HIGH (ref 22–32)
Calcium: 8.2 mg/dL — ABNORMAL LOW (ref 8.9–10.3)
Chloride: 96 mmol/L — ABNORMAL LOW (ref 98–111)
Creatinine, Ser: 0.85 mg/dL (ref 0.61–1.24)
GFR calc Af Amer: >60 mL/min (ref >60)
GFR calc non Af Amer: >60 mL/min (ref >60)
Glucose, Bld: 186 mg/dL — ABNORMAL HIGH (ref 70–99)
Potassium: 3.3 mmol/L — ABNORMAL LOW (ref 3.5–5.1)
Sodium: 143 mmol/L (ref 135–145)

## 2019-02-27 LAB — CBC
HCT: 22.2 % — ABNORMAL LOW (ref 39.0–52.0)
Hemoglobin: 6.5 g/dL — CL (ref 13.0–17.0)
MCH: 30.2 pg (ref 26.0–34.0)
MCHC: 29.3 g/dL — ABNORMAL LOW (ref 30.0–36.0)
MCV: 103.3 fL — ABNORMAL HIGH (ref 80.0–100.0)
Platelets: 316 10*3/uL (ref 150–400)
RBC: 2.15 MIL/uL — ABNORMAL LOW (ref 4.22–5.81)
RDW: 24.8 % — ABNORMAL HIGH (ref 11.5–15.5)
WBC: 9.2 10*3/uL (ref 4.0–10.5)
nRBC: 2.2 % — ABNORMAL HIGH (ref 0.0–0.2)

## 2019-02-27 LAB — MAGNESIUM: Magnesium: 2.2 mg/dL (ref 1.7–2.4)

## 2019-02-27 LAB — PROTIME-INR
INR: 2.2 — ABNORMAL HIGH (ref 0.8–1.2)
Prothrombin Time: 23.9 seconds — ABNORMAL HIGH (ref 11.4–15.2)

## 2019-02-27 LAB — PREPARE RBC (CROSSMATCH)

## 2019-02-27 LAB — SARS CORONAVIRUS 2 (TAT 6-24 HRS): SARS Coronavirus 2: NEGATIVE

## 2019-02-27 NOTE — Consult Note (Addendum)
Chief Complaint: Patient was seen in consultation today for percutaneous gastric tube placement at the request of Dr Theora GianottiS Brown  Supervising Physician: Ruel FavorsShick, Trevor  Patient Status: [facility]  History of Present Illness: Mike Butler is a 73 y.o. 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 To [facility] for management  Previous Covid-- resolved Resp failure Vent/trach On Plavix and Eliquis Must be off Plavix 5 days and off Eliquis 2 days to safely move forward with procedure Discussed with RN-- left order for MD  Dysphagia Malnutrition Deconditioning Need for long term care  Request for percutaneous gastric tube placement Imaging reviewed and procedure approved   Past Medical History:  Diagnosis Date   Acute on chronic respiratory failure with hypoxia (HCC)    Chronic atrial fibrillation (HCC)    Congestive heart failure, NYHA class 3, unspecified failure chronicity, combined (HCC)    COVID-19 virus infection    Pneumonia due to COVID-19 virus       Allergies: Patient has no allergy information on record.  Medications: Prior to Admission medications   Not on File     No family history on file.  Social History   Socioeconomic History   Marital status: Married    Spouse name: Not on file   Number of children: Not on file   Years of education: Not on file   Highest education level: Not on file  Occupational History   Not on file  Social Needs   Financial resource strain: Not on file   Food insecurity    Worry: Not on file    Inability: Not on file   Transportation needs    Medical: Not on file    Non-medical: Not on file  Tobacco Use   Smoking status: Not on file  Substance and Sexual Activity   Alcohol use: Not on file   Drug use: Not on file   Sexual activity: Not on file  Lifestyle   Physical activity    Days per week: Not on file    Minutes per session: Not on file   Stress: Not on file  Relationships    Social connections    Talks on phone: Not on file    Gets together: Not on file    Attends religious service: Not on file    Active member of club or organization: Not on file    Attends meetings of clubs or organizations: Not on file    Relationship status: Not on file  Other Topics Concern   Not on file  Social History Narrative   Not on file    Review of Systems: A 12 point ROS discussed and pertinent positives are indicated in the HPI above.  All other systems are negative.   Vital Signs: There were no vitals taken for this visit.  Physical Exam Vitals signs reviewed.  Cardiovascular:     Heart sounds: Normal heart sounds.  Pulmonary:     Breath sounds: Normal breath sounds.     Comments: vent Skin:    General: Skin is warm and dry.  Psychiatric:     Comments: Spoke to wife Mike Butler via phone Consents for procedure     Imaging: Ct Abdomen Wo Contrast  Result Date: 02/24/2019 CLINICAL DATA:  Preop planning for gastrostomy catheter placement EXAM: CT ABDOMEN WITHOUT CONTRAST TECHNIQUE: Multidetector CT imaging of the abdomen was performed following the standard protocol without IV contrast. COMPARISON:  None. FINDINGS: Lower chest: Bilateral pleural effusions. Coronary calcifications. Transvenous pacing leads  partially visualized. Hepatobiliary: No focal liver abnormality is seen. Gallbladder is decompressed or absent. No biliary dilatation. Pancreas: Unremarkable. No pancreatic ductal dilatation or surrounding inflammatory changes. Spleen: Normal in size without focal abnormality. Small accessory splenule. Adrenals/Urinary Tract: Normal adrenals. Bilateral low-attenuation renal lesions largest 5.4 cm lower right, probably cysts but incompletely characterized. No urolithiasis or hydronephrosis. Stomach/Bowel: Nasogastric tube into the decompressed stomach. There is safe percutaneous window for gastrostomy placement. Visualized portions of small bowel and colon are decompressed,  unremarkable. Vascular/Lymphatic: Calcified aortic plaque. Circumaortic left renal vein, an anatomic variant. No adenopathy localized. Other: No ascites. No free air. Musculoskeletal: Sternotomy defect and wires. Instrumented fusion L3-L5. Bridging osteophytes throughout the visualized lower thoracic and lumbar spine. no fracture or worrisome bone lesion. IMPRESSION: 1. There is safe percutaneous window for gastrostomy placement. 2. Bilateral pleural effusions. 3. Coronary and Aortic Atherosclerosis (ICD10-I70.0). Electronically Signed   By: Lucrezia Europe M.D.   On: 02/24/2019 07:22   Dg Abd 1 View  Result Date: 02/18/2019 CLINICAL DATA:  NG tube placement. EXAM: ABDOMEN - 1 VIEW COMPARISON:  None. FINDINGS: Enteric tube in the stomach. The visualized bowel gas pattern is normal. No radio-opaque calculi or other significant radiographic abnormality are seen. Prior lumbar fusion. No acute osseous abnormality. IMPRESSION: Enteric tube in the stomach. Electronically Signed   By: Titus Dubin M.D.   On: 02/10/2019 21:08   Dg Chest Port 1 View  Result Date: 02/26/2019 CLINICAL DATA:  Pulmonary edema. EXAM: PORTABLE CHEST 1 VIEW COMPARISON:  Chest radiograph 02/23/2019 FINDINGS: Monitoring leads overlie the patient. Enteric tube courses inferior to the diaphragm. Stable cardiomegaly status post median sternotomy. Multi lead pacer apparatus overlies the left hemithorax. Persistent small left pleural effusion and underlying consolidation. Similar-appearing bilateral interstitial pulmonary opacities. No pneumothorax. IMPRESSION: Cardiomegaly with findings suggestive of interstitial pulmonary edema. Superimposed infection not excluded. Small left pleural effusion and underlying consolidation. Electronically Signed   By: Lovey Newcomer M.D.   On: 02/26/2019 09:00   Dg Chest Port 1 View  Result Date: 02/23/2019 CLINICAL DATA:  Pulmonary edema.  Tracheostomy. EXAM: PORTABLE CHEST 1 VIEW COMPARISON:  01/23/2019  FINDINGS: Tracheostomy in good position. NG tube in the stomach. Pacemaker leads unchanged. Postop CABG. Diffuse bilateral airspace disease with mild progression. Progression of small right pleural effusion. Progression of bibasilar atelectasis. No pneumothorax. IMPRESSION: Mild progression of pulmonary edema and small right effusion. Progression of bibasilar atelectasis. Electronically Signed   By: Franchot Gallo M.D.   On: 02/23/2019 06:56   Dg Chest Port 1 View  Result Date: 02/15/2019 CLINICAL DATA:  Respiratory failure EXAM: PORTABLE CHEST 1 VIEW COMPARISON:  None. FINDINGS: Cardiomegaly and findings of CABG. Left chest wall pacemaker. Mild interstitial pulmonary edema. No pneumothorax. Small left pleural effusion. Enteric tube courses below the field of view. IMPRESSION: 1. Findings of congestive heart failure: Mild interstitial pulmonary edema, cardiomegaly and small left pleural effusion. 2. Enteric tube courses below the field-of-view. Electronically Signed   By: Ulyses Jarred M.D.   On: 02/01/2019 22:42   US Abdomen Limited Ruq  Result Date: 02/16/2019 CLINICAL DATA:  Elevated LFT EXAM: ULTRASOUND ABDOMEN LIMITED RIGHT UPPER QUADRANT COMPARISON:  None. FINDINGS: Gallbladder: Unable to be visualized likely due to physical condition and technical factors. Common bile duct: Diameter: 3.4 mm Liver: Poorly visible. Appears hypoechoic and dense. No gross focal abnormality. Portal vein is patent on color Doppler imaging with normal direction of blood flow towards the liver. Other: None. IMPRESSION: 1. Gallbladder was unable to be  visualized. 2. Hypoechoic difficult to penetrate liver, question acute hepatitis. No gross focal hepatic abnormality. Electronically Signed   By: Jasmine Pang M.D.   On: 02/16/2019 19:43    Labs:  CBC: Recent Labs    02/20/19 0743 02/22/19 1039 02/26/19 0504 02/27/19 0556  WBC 5.6 7.7 10.1 9.2  HGB 7.4* 7.4* 8.0* 6.5*  HCT 23.7* 24.7* 27.5* 22.2*  PLT 180 240  319 316    COAGS: Recent Labs    02/27/19 0556  INR 2.2*    BMP: Recent Labs    02/24/19 0605 02/25/19 1309 02/26/19 0504 02/27/19 0556  NA 142 142 144 143  K 4.0 3.5 3.6 3.3*  CL 96* 94* 96* 96*  CO2 34* 37* 38* 34*  GLUCOSE 172* 221* 185* 186*  BUN 66* 68* 72* 68*  CALCIUM 8.3* 8.5* 8.3* 8.2*  CREATININE 0.81 0.88 0.82 0.85  GFRNONAA >60 >60 >60 >60  GFRAA >60 >60 >60 >60    LIVER FUNCTION TESTS: Recent Labs    02/18/19 0714 02/20/19 0743 02/25/19 1309 02/26/19 0504  BILITOT 0.9 0.9 1.5* 1.3*  AST 42* 38 26 34  ALT 156* 95* 56* 51*  ALKPHOS 174* 166* 189* 219*  PROT 4.8* 5.0* 5.7* 5.6*  ALBUMIN 1.7* 1.8* 1.4* 1.4*    TUMOR MARKERS: No results for input(s): AFPTM, CEA, CA199, CHROMGRNA in the last 8760 hours.  Assessment and Plan:  Dysphagia Malnutrition Deconditioning Need for long term care Scheduled for percutaneous gastric tube placement Risks and benefits image guided gastrostomy tube placement was discussed with the patient's wife Mike Ang via phone including, but not limited to the need for a barium enema during the procedure, bleeding, infection, peritonitis and/or damage to adjacent structures.  All questions were answered, she is agreeable to proceed. Consent signed and in chart.   Thank you for this interesting consult.  I greatly enjoyed meeting Mike Butler and look forward to participating in their care.  A copy of this report was sent to the requesting provider on this date.  Electronically Signed: Robet Leu, PA-C 02/27/2019, 1:07 PM   I spent a total of 40 Minutes    in face to face in clinical consultation, greater than 50% of which was counseling/coordinating care for percutaneous gastric tube placement

## 2019-02-27 NOTE — Progress Notes (Addendum)
Pulmonary Critical Care Medicine [facility] [place]   PULMONARY CRITICAL CARE SERVICE  PROGRESS NOTE  Date of Service: 02/27/2019  Mike Butler  LZJ:[id number]  DOB: 30-Jun-1945   DOA: 02/18/2019  Referring Physician: Carron Curie, MD  HPI: Mike Butler is a 73 y.o. male seen for follow up of Acute on Chronic Respiratory Failure.  Had an 8-hour goal today on aerosol trach collar however failed due to decreased aeration and increased respiratory rate.  Currently resting on pressure support 12/7 with FiO2 of 50%.  Medications: Reviewed on Rounds  Physical Exam:  Vitals: Pulse 76 respirations 20 BP 111/51 O2 sat 96% temp 97.0  Ventilator Settings pressure 12/7 FiO2 50%  . General: Comfortable at this time . Eyes: Grossly normal lids, irises & conjunctiva . ENT: grossly tongue is normal . Neck: no obvious mass . Cardiovascular: S1 S2 normal no gallop . Respiratory: No rales or rhonchi noted . Abdomen: soft . Skin: no rash seen on limited exam . Musculoskeletal: not rigid . Psychiatric:unable to assess . Neurologic: no seizure no involuntary movements         Lab Data:   Basic Metabolic Panel: Recent Labs  Lab 02/22/19 0948 02/23/19 0445 02/24/19 0605 02/25/19 1309 02/26/19 0504 02/27/19 0556  NA 142 144 142 142 144 143  K 3.0* 4.4 4.0 3.5 3.6 3.3*  CL 97* 98 96* 94* 96* 96*  CO2 36* 32 34* 37* 38* 34*  GLUCOSE 169* 127* 172* 221* 185* 186*  BUN 55* 60* 66* 68* 72* 68*  CREATININE 0.70 0.81 0.81 0.88 0.82 0.85  CALCIUM 7.8* 7.8* 8.3* 8.5* 8.3* 8.2*  MG 1.8  --   --   --  2.3 2.2    ABG: No results for input(s): PHART, PCO2ART, PO2ART, HCO3, O2SAT in the last 168 hours.  Liver Function Tests: Recent Labs  Lab 02/25/19 1309 02/26/19 0504  AST 26 34  ALT 56* 51*  ALKPHOS 189* 219*  BILITOT 1.5* 1.3*  PROT 5.7* 5.6*  ALBUMIN 1.4* 1.4*   No results for input(s): LIPASE, AMYLASE in the last 168 hours. No results for input(s): AMMONIA in  the last 168 hours.  CBC: Recent Labs  Lab 02/22/19 1039 02/26/19 0504 02/27/19 0556  WBC 7.7 10.1 9.2  HGB 7.4* 8.0* 6.5*  HCT 24.7* 27.5* 22.2*  MCV 100.8* 104.6* 103.3*  PLT 240 319 316    Cardiac Enzymes: No results for input(s): CKTOTAL, CKMB, CKMBINDEX, TROPONINI in the last 168 hours.  BNP (last 3 results) No results for input(s): BNP in the last 8760 hours.  ProBNP (last 3 results) No results for input(s): PROBNP in the last 8760 hours.  Radiological Exams: Dg Chest Port 1 View  Result Date: 02/26/2019 CLINICAL DATA:  Pulmonary edema. EXAM: PORTABLE CHEST 1 VIEW COMPARISON:  Chest radiograph 02/23/2019 FINDINGS: Monitoring leads overlie the patient. Enteric tube courses inferior to the diaphragm. Stable cardiomegaly status post median sternotomy. Multi lead pacer apparatus overlies the left hemithorax. Persistent small left pleural effusion and underlying consolidation. Similar-appearing bilateral interstitial pulmonary opacities. No pneumothorax. IMPRESSION: Cardiomegaly with findings suggestive of interstitial pulmonary edema. Superimposed infection not excluded. Small left pleural effusion and underlying consolidation. Electronically Signed   By: Annia Belt M.D.   On: 02/26/2019 09:00    Assessment/Plan Active Problems:   Acute on chronic respiratory failure with hypoxia (HCC)   COVID-19 virus infection   Pneumonia due to COVID-19 virus   Chronic atrial fibrillation (HCC)   Congestive heart failure, NYHA class  3, unspecified failure chronicity, combined ([place])   1. Acute on chronic respiratory failure with hypoxiapatient to wean today to aerosol trach collar and is continuing on pressure support.  Continue supportive measures and pulmonary toilet 2. COVID-19 virus infection in recovery phase 3. Pneumonia due to COVID-19 treated 4. Chronic atrial fibrillation rate is controlled  5. Congestive heart failure patient is at baseline   I have personally seen and  evaluated the patient, evaluated laboratory and imaging results, formulated the assessment and plan and placed orders. The Patient requires high complexity decision making for assessment and support.  Case was discussed on Rounds with the Respiratory Therapy Staff  Allyne Gee, MD [facility] Pulmonary Critical Care Medicine Sleep Medicine

## 2019-02-28 DIAGNOSIS — I5040 Unspecified combined systolic (congestive) and diastolic (congestive) heart failure: Secondary | ICD-10-CM | POA: Diagnosis not present

## 2019-02-28 DIAGNOSIS — U071 COVID-19: Secondary | ICD-10-CM | POA: Diagnosis not present

## 2019-02-28 DIAGNOSIS — J9621 Acute and chronic respiratory failure with hypoxia: Secondary | ICD-10-CM | POA: Diagnosis not present

## 2019-02-28 DIAGNOSIS — I482 Chronic atrial fibrillation, unspecified: Secondary | ICD-10-CM | POA: Diagnosis not present

## 2019-02-28 LAB — CBC
HCT: 25.7 % — ABNORMAL LOW (ref 39.0–52.0)
Hemoglobin: 7.8 g/dL — ABNORMAL LOW (ref 13.0–17.0)
MCH: 30.4 pg (ref 26.0–34.0)
MCHC: 30.4 g/dL (ref 30.0–36.0)
MCV: 100 fL (ref 80.0–100.0)
Platelets: 317 10*3/uL (ref 150–400)
RBC: 2.57 MIL/uL — ABNORMAL LOW (ref 4.22–5.81)
RDW: 25.6 % — ABNORMAL HIGH (ref 11.5–15.5)
WBC: 11 10*3/uL — ABNORMAL HIGH (ref 4.0–10.5)
nRBC: 2.1 % — ABNORMAL HIGH (ref 0.0–0.2)

## 2019-02-28 LAB — MAGNESIUM
Magnesium: 2.3 mg/dL (ref 1.7–2.4)
Magnesium: 2.3 mg/dL (ref 1.7–2.4)

## 2019-02-28 LAB — TYPE AND SCREEN
ABO/RH(D): A POS
Antibody Screen: NEGATIVE
Unit division: 0

## 2019-02-28 LAB — BASIC METABOLIC PANEL
Anion gap: 11 (ref 5–15)
BUN: 69 mg/dL — ABNORMAL HIGH (ref 8–23)
CO2: 37 mmol/L — ABNORMAL HIGH (ref 22–32)
Calcium: 8.4 mg/dL — ABNORMAL LOW (ref 8.9–10.3)
Chloride: 98 mmol/L (ref 98–111)
Creatinine, Ser: 0.88 mg/dL (ref 0.61–1.24)
GFR calc Af Amer: >60 mL/min (ref >60)
GFR calc non Af Amer: >60 mL/min (ref >60)
Glucose, Bld: 211 mg/dL — ABNORMAL HIGH (ref 70–99)
Potassium: 3.7 mmol/L (ref 3.5–5.1)
Sodium: 146 mmol/L — ABNORMAL HIGH (ref 135–145)

## 2019-02-28 LAB — BPAM RBC
Blood Product Expiration Date: 202011292359
ISSUE DATE / TIME: 202011091240
Unit Type and Rh: 6200

## 2019-02-28 LAB — PATHOLOGIST SMEAR REVIEW

## 2019-02-28 LAB — PHOSPHORUS: Phosphorus: 4 mg/dL (ref 2.5–4.6)

## 2019-02-28 NOTE — Progress Notes (Signed)
Pulmonary Critical Care Medicine [facility] [place]   PULMONARY CRITICAL CARE SERVICE  PROGRESS NOTE  Date of Service: 02/28/2019  Mike Butler  WUJ:[id number]  DOB: 1946-04-16   DOA: 02/20/19  Referring Physician: Carron Curie, MD  HPI: Mike Butler is a 73 y.o. male seen for follow up of Acute on Chronic Respiratory Failure.  Patient is on pressure support right now S attempted on T collar yesterday did not tolerate however did do 12 hours on pressure support  Medications: Reviewed on Rounds  Physical Exam:  Vitals: Temperature 99.1 pulse 77 respiratory rate 26 blood pressure 151/65 saturations 98%  Ventilator Settings mode of ventilation pressure support FiO2 50% tidal volume 414 pressure 12 PEEP 5  . General: Comfortable at this time . Eyes: Grossly normal lids, irises & conjunctiva . ENT: grossly tongue is normal . Neck: no obvious mass . Cardiovascular: S1 S2 normal no gallop . Respiratory: No rhonchi no rales are noted at this time . Abdomen: soft . Skin: no rash seen on limited exam . Musculoskeletal: not rigid . Psychiatric:unable to assess . Neurologic: no seizure no involuntary movements         Lab Data:   Basic Metabolic Panel: Recent Labs  Lab 02/22/19 0948  02/24/19 0605 02/25/19 1309 02/26/19 0504 02/27/19 0556 02/28/19 0404  NA 142   < > 142 142 144 143 146*  K 3.0*   < > 4.0 3.5 3.6 3.3* 3.7  CL 97*   < > 96* 94* 96* 96* 98  CO2 36*   < > 34* 37* 38* 34* 37*  GLUCOSE 169*   < > 172* 221* 185* 186* 211*  BUN 55*   < > 66* 68* 72* 68* 69*  CREATININE 0.70   < > 0.81 0.88 0.82 0.85 0.88  CALCIUM 7.8*   < > 8.3* 8.5* 8.3* 8.2* 8.4*  MG 1.8  --   --   --  2.3 2.2 2.3  PHOS  --   --   --   --   --   --  4.0   < > = values in this interval not displayed.    ABG: No results for input(s): PHART, PCO2ART, PO2ART, HCO3, O2SAT in the last 168 hours.  Liver Function Tests: Recent Labs  Lab 02/25/19 1309 02/26/19 0504  AST 26  34  ALT 56* 51*  ALKPHOS 189* 219*  BILITOT 1.5* 1.3*  PROT 5.7* 5.6*  ALBUMIN 1.4* 1.4*   No results for input(s): LIPASE, AMYLASE in the last 168 hours. No results for input(s): AMMONIA in the last 168 hours.  CBC: Recent Labs  Lab 02/22/19 1039 02/26/19 0504 02/27/19 0556 02/28/19 0404  WBC 7.7 10.1 9.2 11.0*  HGB 7.4* 8.0* 6.5* 7.8*  HCT 24.7* 27.5* 22.2* 25.7*  MCV 100.8* 104.6* 103.3* 100.0  PLT 240 319 316 317    Cardiac Enzymes: No results for input(s): CKTOTAL, CKMB, CKMBINDEX, TROPONINI in the last 168 hours.  BNP (last 3 results) No results for input(s): BNP in the last 8760 hours.  ProBNP (last 3 results) No results for input(s): PROBNP in the last 8760 hours.  Radiological Exams: No results found.  Assessment/Plan Active Problems:   Acute on chronic respiratory failure with hypoxia (HCC)   COVID-19 virus infection   Pneumonia due to COVID-19 virus   Chronic atrial fibrillation (HCC)   Congestive heart failure, NYHA class 3, unspecified failure chronicity, combined (HCC)   1. Acute on chronic respiratory failure with hypoxia we  will continue with pressure support mode as noted patient was able to complete 12 hours yesterday plan is to advance further today 2. COVID-19 virus infection in recovery phase 3. Pneumonia due to COVID-19 will continue with supportive care 4. Chronic atrial fibrillation rate controlled 5. Congestive heart failure class III continue with supportive care appears to be compensated right now   I have personally seen and evaluated the patient, evaluated laboratory and imaging results, formulated the assessment and plan and placed orders. The Patient requires high complexity decision making for assessment and support.  Case was discussed on Rounds with the Respiratory Therapy Staff  Allyne Gee, MD [facility] Pulmonary Critical Care Medicine Sleep Medicine

## 2019-03-01 ENCOUNTER — Other Ambulatory Visit (HOSPITAL_COMMUNITY): Payer: Medicare HMO

## 2019-03-01 DIAGNOSIS — U071 COVID-19: Secondary | ICD-10-CM | POA: Diagnosis not present

## 2019-03-01 DIAGNOSIS — J9621 Acute and chronic respiratory failure with hypoxia: Secondary | ICD-10-CM | POA: Diagnosis not present

## 2019-03-01 DIAGNOSIS — I482 Chronic atrial fibrillation, unspecified: Secondary | ICD-10-CM | POA: Diagnosis not present

## 2019-03-01 DIAGNOSIS — I5040 Unspecified combined systolic (congestive) and diastolic (congestive) heart failure: Secondary | ICD-10-CM | POA: Diagnosis not present

## 2019-03-01 LAB — BASIC METABOLIC PANEL
Anion gap: 13 (ref 5–15)
BUN: 73 mg/dL — ABNORMAL HIGH (ref 8–23)
CO2: 36 mmol/L — ABNORMAL HIGH (ref 22–32)
Calcium: 8.1 mg/dL — ABNORMAL LOW (ref 8.9–10.3)
Chloride: 95 mmol/L — ABNORMAL LOW (ref 98–111)
Creatinine, Ser: 0.81 mg/dL (ref 0.61–1.24)
GFR calc Af Amer: >60 mL/min (ref >60)
GFR calc non Af Amer: >60 mL/min (ref >60)
Glucose, Bld: 218 mg/dL — ABNORMAL HIGH (ref 70–99)
Potassium: 4.1 mmol/L (ref 3.5–5.1)
Sodium: 144 mmol/L (ref 135–145)

## 2019-03-01 LAB — PHOSPHORUS: Phosphorus: 4.2 mg/dL (ref 2.5–4.6)

## 2019-03-01 LAB — URINALYSIS, ROUTINE W REFLEX MICROSCOPIC
Bilirubin Urine: NEGATIVE
Glucose, UA: 150 mg/dL — AB
Ketones, ur: NEGATIVE mg/dL
Nitrite: NEGATIVE
Protein, ur: NEGATIVE mg/dL
Specific Gravity, Urine: 1.015 (ref 1.005–1.030)
pH: 5 (ref 5.0–8.0)

## 2019-03-01 LAB — OCCULT BLOOD X 1 CARD TO LAB, STOOL: Fecal Occult Bld: NEGATIVE

## 2019-03-01 LAB — CBC
HCT: 26.7 % — ABNORMAL LOW (ref 39.0–52.0)
Hemoglobin: 7.9 g/dL — ABNORMAL LOW (ref 13.0–17.0)
MCH: 29.9 pg (ref 26.0–34.0)
MCHC: 29.6 g/dL — ABNORMAL LOW (ref 30.0–36.0)
MCV: 101.1 fL — ABNORMAL HIGH (ref 80.0–100.0)
Platelets: 311 10*3/uL (ref 150–400)
RBC: 2.64 MIL/uL — ABNORMAL LOW (ref 4.22–5.81)
RDW: 24.3 % — ABNORMAL HIGH (ref 11.5–15.5)
WBC: 15 10*3/uL — ABNORMAL HIGH (ref 4.0–10.5)
nRBC: 1 % — ABNORMAL HIGH (ref 0.0–0.2)

## 2019-03-01 LAB — BLOOD GAS, ARTERIAL
Acid-Base Excess: 16.5 mmol/L — ABNORMAL HIGH (ref 0.0–2.0)
Bicarbonate: 42.1 mmol/L — ABNORMAL HIGH (ref 20.0–28.0)
FIO2: 100
O2 Saturation: 49.2 %
Patient temperature: 37
pCO2 arterial: 68.2 mmHg (ref 32.0–48.0)
pH, Arterial: 7.408 (ref 7.350–7.450)
pO2, Arterial: 31 mmHg — CL (ref 83.0–108.0)

## 2019-03-01 LAB — MAGNESIUM: Magnesium: 2.3 mg/dL (ref 1.7–2.4)

## 2019-03-01 NOTE — Progress Notes (Signed)
Pulmonary Critical Care Medicine [facility] [place]   PULMONARY CRITICAL CARE SERVICE  PROGRESS NOTE  Date of Service: 03/01/2019  Mike Butler  WGY:[id number]  DOB: [birth year]   DOA: 24-Feb-2019  Referring Physician: Carron Curie, MD  HPI: Mike Butler is a 73 y.o. male seen for follow up of Acute on Chronic Respiratory Failure.  Chest x-ray still revealing significant interstitial edema patient should have his PEEP increased also would like to diurese little bit more aggressively  Medications: Reviewed on Rounds  Physical Exam:  Vitals: Temperature 97.8 pulse 79 respiratory 18 blood pressure 118/54 saturations 98%  Ventilator Settings mode ventilation assist control FiO2 is 90% PEEP 7 tidal volume 489  . General: Comfortable at this time . Eyes: Grossly normal lids, irises & conjunctiva . ENT: grossly tongue is normal . Neck: no obvious mass . Cardiovascular: S1 S2 normal no gallop . Respiratory: No rhonchi no rales are noted at this time . Abdomen: soft . Skin: no rash seen on limited exam . Musculoskeletal: not rigid . Psychiatric:unable to assess . Neurologic: no seizure no involuntary movements         Lab Data:   Basic Metabolic Panel: Recent Labs  Lab 02/25/19 1309 02/26/19 0504 02/27/19 0556 02/28/19 0404 02/28/19 1326 03/01/19 0608  NA 142 144 143 146*  --  144  K 3.5 3.6 3.3* 3.7  --  4.1  CL 94* 96* 96* 98  --  95*  CO2 37* 38* 34* 37*  --  36*  GLUCOSE 221* 185* 186* 211*  --  218*  BUN 68* 72* 68* 69*  --  73*  CREATININE 0.88 0.82 0.85 0.88  --  0.81  CALCIUM 8.5* 8.3* 8.2* 8.4*  --  8.1*  MG  --  2.3 2.2 2.3 2.3 2.3  PHOS  --   --   --  4.0  --  4.2    ABG: Recent Labs  Lab 03/01/19 0811  PHART 7.408  PCO2ART 68.2*  PO2ART 31.0*  HCO3 42.1*  O2SAT 49.2    Liver Function Tests: Recent Labs  Lab 02/25/19 1309 02/26/19 0504  AST 26 34  ALT 56* 51*  ALKPHOS 189* 219*  BILITOT 1.5* 1.3*  PROT 5.7* 5.6*  ALBUMIN  1.4* 1.4*   No results for input(s): LIPASE, AMYLASE in the last 168 hours. No results for input(s): AMMONIA in the last 168 hours.  CBC: Recent Labs  Lab 02/22/19 1039 02/26/19 0504 02/27/19 0556 02/28/19 0404 03/01/19 0934  WBC 7.7 10.1 9.2 11.0* 15.0*  HGB 7.4* 8.0* 6.5* 7.8* 7.9*  HCT 24.7* 27.5* 22.2* 25.7* 26.7*  MCV 100.8* 104.6* 103.3* 100.0 101.1*  PLT 240 319 316 317 311    Cardiac Enzymes: No results for input(s): CKTOTAL, CKMB, CKMBINDEX, TROPONINI in the last 168 hours.  BNP (last 3 results) No results for input(s): BNP in the last 8760 hours.  ProBNP (last 3 results) No results for input(s): PROBNP in the last 8760 hours.  Radiological Exams: No results found.  Assessment/Plan Active Problems:   Acute on chronic respiratory failure with hypoxia (HCC)   COVID-19 virus infection   Pneumonia due to COVID-19 virus   Chronic atrial fibrillation (HCC)   Congestive heart failure, NYHA class 3, unspecified failure chronicity, combined (HCC)   1. Acute on chronic respiratory failure hypoxia patient is on 90% FiO2 we will increase the PEEP between 10 and 12 and try to wean FiO2 down.  Also chest x-ray ordered 2. COVID-19 virus infection  in recovery phase 3. Pneumonia due to COVID-19 treated 4. Chronic atrial fibrillation rate is controlled 5. Congestive heart failure diuretics discussed on rounds   I have personally seen and evaluated the patient, evaluated laboratory and imaging results, formulated the assessment and plan and placed orders. The Patient requires high complexity decision making for assessment and support.  Case was discussed on Rounds with the Respiratory Therapy Staff  Allyne Gee, MD [facility] Pulmonary Critical Care Medicine Sleep Medicine

## 2019-03-02 ENCOUNTER — Other Ambulatory Visit (HOSPITAL_COMMUNITY): Payer: Medicare HMO

## 2019-03-02 DIAGNOSIS — I5040 Unspecified combined systolic (congestive) and diastolic (congestive) heart failure: Secondary | ICD-10-CM | POA: Diagnosis not present

## 2019-03-02 DIAGNOSIS — J9621 Acute and chronic respiratory failure with hypoxia: Secondary | ICD-10-CM | POA: Diagnosis not present

## 2019-03-02 DIAGNOSIS — I482 Chronic atrial fibrillation, unspecified: Secondary | ICD-10-CM | POA: Diagnosis not present

## 2019-03-02 DIAGNOSIS — U071 COVID-19: Secondary | ICD-10-CM | POA: Diagnosis not present

## 2019-03-02 LAB — RENAL FUNCTION PANEL
Albumin: 1.4 g/dL — ABNORMAL LOW (ref 3.5–5.0)
Anion gap: 10 (ref 5–15)
BUN: 66 mg/dL — ABNORMAL HIGH (ref 8–23)
CO2: 39 mmol/L — ABNORMAL HIGH (ref 22–32)
Calcium: 8.4 mg/dL — ABNORMAL LOW (ref 8.9–10.3)
Chloride: 98 mmol/L (ref 98–111)
Creatinine, Ser: 0.83 mg/dL (ref 0.61–1.24)
GFR calc Af Amer: >60 mL/min (ref >60)
GFR calc non Af Amer: >60 mL/min (ref >60)
Glucose, Bld: 223 mg/dL — ABNORMAL HIGH (ref 70–99)
Phosphorus: 4 mg/dL (ref 2.5–4.6)
Potassium: 3.4 mmol/L — ABNORMAL LOW (ref 3.5–5.1)
Sodium: 147 mmol/L — ABNORMAL HIGH (ref 135–145)

## 2019-03-02 LAB — BLOOD GAS, ARTERIAL
Acid-Base Excess: 15.9 mmol/L — ABNORMAL HIGH (ref 0.0–2.0)
Bicarbonate: 41.4 mmol/L — ABNORMAL HIGH (ref 20.0–28.0)
FIO2: 100
O2 Saturation: 94.9 %
Patient temperature: 37
pCO2 arterial: 66.1 mmHg (ref 32.0–48.0)
pH, Arterial: 7.413 (ref 7.350–7.450)
pO2, Arterial: 77.3 mmHg — ABNORMAL LOW (ref 83.0–108.0)

## 2019-03-02 LAB — CBC
HCT: 24.6 % — ABNORMAL LOW (ref 39.0–52.0)
Hemoglobin: 7.1 g/dL — ABNORMAL LOW (ref 13.0–17.0)
MCH: 29.6 pg (ref 26.0–34.0)
MCHC: 28.9 g/dL — ABNORMAL LOW (ref 30.0–36.0)
MCV: 102.5 fL — ABNORMAL HIGH (ref 80.0–100.0)
Platelets: 314 10*3/uL (ref 150–400)
RBC: 2.4 MIL/uL — ABNORMAL LOW (ref 4.22–5.81)
RDW: 24.3 % — ABNORMAL HIGH (ref 11.5–15.5)
WBC: 13.5 10*3/uL — ABNORMAL HIGH (ref 4.0–10.5)
nRBC: 0.9 % — ABNORMAL HIGH (ref 0.0–0.2)

## 2019-03-02 LAB — MAGNESIUM: Magnesium: 2.5 mg/dL — ABNORMAL HIGH (ref 1.7–2.4)

## 2019-03-02 NOTE — Progress Notes (Signed)
Pulmonary Critical Care Medicine [place]   PULMONARY CRITICAL CARE SERVICE  PROGRESS NOTE  Date of Service: 03/09/2019  Mike Butler  JJO:[id number]  DOB: 07-Apr-1946   DOA: 02/09/2019  Referring Physician: Merton Border, MD  HPI: Mike Butler is a 73 y.o. male seen for follow up of Acute on Chronic Respiratory Failure.  Patient is on full support PEEP was at 10 oxygenation seems to be doing a little bit better right now is on 70% FiO2  Medications: Reviewed on Rounds  Physical Exam:  Vitals: Temperature 97.1 pulse 76 respiratory 24 blood pressure 118/55 saturations 96%  Ventilator Settings mode ventilation assist control FiO2 70% tidal volume 459 PEEP 10  . General: Comfortable at this time . Eyes: Grossly normal lids, irises & conjunctiva . ENT: grossly tongue is normal . Neck: no obvious mass . Cardiovascular: S1 S2 normal no gallop . Respiratory: No rhonchi no rales are noted at this time . Abdomen: soft . Skin: no rash seen on limited exam . Musculoskeletal: not rigid . Psychiatric:unable to assess . Neurologic: no seizure no involuntary movements         Lab Data:   Basic Metabolic Panel: Recent Labs  Lab 02/26/19 0504 02/27/19 0556 02/28/19 0404 02/28/19 1326 03/01/19 0608 03-09-2019 0458  NA 144 143 146*  --  144 147*  K 3.6 3.3* 3.7  --  4.1 3.4*  CL 96* 96* 98  --  95* 98  CO2 38* 34* 37*  --  36* 39*  GLUCOSE 185* 186* 211*  --  218* 223*  BUN 72* 68* 69*  --  73* 66*  CREATININE 0.82 0.85 0.88  --  0.81 0.83  CALCIUM 8.3* 8.2* 8.4*  --  8.1* 8.4*  MG 2.3 2.2 2.3 2.3 2.3 2.5*  PHOS  --   --  4.0  --  4.2 4.0    ABG: Recent Labs  Lab 03/01/19 0811  PHART 7.408  PCO2ART 68.2*  PO2ART 31.0*  HCO3 42.1*  O2SAT 49.2    Liver Function Tests: Recent Labs  Lab 02/25/19 1309 02/26/19 0504 Mar 09, 2019 0458  AST 26 34  --   ALT 56* 51*  --   ALKPHOS 189* 219*  --   BILITOT 1.5* 1.3*  --   PROT 5.7* 5.6*  --    ALBUMIN 1.4* 1.4* 1.4*   No results for input(s): LIPASE, AMYLASE in the last 168 hours. No results for input(s): AMMONIA in the last 168 hours.  CBC: Recent Labs  Lab 02/26/19 0504 02/27/19 0556 02/28/19 0404 03/01/19 0934 2019/03/09 0458  WBC 10.1 9.2 11.0* 15.0* 13.5*  HGB 8.0* 6.5* 7.8* 7.9* 7.1*  HCT 27.5* 22.2* 25.7* 26.7* 24.6*  MCV 104.6* 103.3* 100.0 101.1* 102.5*  PLT 319 316 317 311 314    Cardiac Enzymes: No results for input(s): CKTOTAL, CKMB, CKMBINDEX, TROPONINI in the last 168 hours.  BNP (last 3 results) No results for input(s): BNP in the last 8760 hours.  ProBNP (last 3 results) No results for input(s): PROBNP in the last 8760 hours.  Radiological Exams: Dg Chest Port 1 View  Result Date: 03/01/2019 CLINICAL DATA:  Respiratory failure EXAM: PORTABLE CHEST 1 VIEW COMPARISON:  02/26/2019 FINDINGS: No significant interval change in AP portable chest radiograph with diffuse bilateral interstitial pulmonary opacity and small layering pleural effusions. No new airspace opacity. Tracheostomy. Cardiomegaly status post median sternotomy with left chest multi lead pacer. Esophagogastric tube with tip and side port below the diaphragm. IMPRESSION: No  significant interval change in AP portable chest radiograph with diffuse bilateral interstitial pulmonary opacity and small layering pleural effusions, findings consistent with infection or edema. No new airspace opacity. Tracheostomy. Electronically Signed   By: Lauralyn Primes M.D.   On: 03/01/2019 12:37    Assessment/Plan Active Problems:   Acute on chronic respiratory failure with hypoxia (HCC)   COVID-19 virus infection   Pneumonia due to COVID-19 virus   Chronic atrial fibrillation (HCC)   Congestive heart failure, NYHA class 3, unspecified failure chronicity, combined (HCC)   1. Acute on chronic respiratory failure with hypoxia we will continue with trying to titrate the FiO2 down as tolerated right now is on 70%  down from 90%. 2. COVID-19 virus infection in resolution phase 3. Pneumonia due to COVID-19 treated we will continue to follow along 4. Chronic atrial fibrillation rate controlled 5. Congestive heart failure monitor fluid status diurese as tolerated   I have personally seen and evaluated the patient, evaluated laboratory and imaging results, formulated the assessment and plan and placed orders. The Patient requires high complexity decision making for assessment and support.  Case was discussed on Rounds with the Respiratory Therapy Staff  Yevonne Pax, MD [facility] Pulmonary Critical Care Medicine Sleep Medicine

## 2019-03-03 LAB — CULTURE, RESPIRATORY W GRAM STAIN

## 2019-03-03 LAB — URINE CULTURE: Culture: <10000 — AB

## 2019-03-03 MED FILL — Medication: Qty: 1 | Status: AC

## 2019-03-21 DEATH — deceased

## 2021-10-09 IMAGING — DX DG CHEST 1V PORT
1 series · 1 of 1 positions shown · non-contrast
Comparison: 02/26/2019

CLINICAL DATA: Respiratory failure

EXAM:
PORTABLE CHEST 1 VIEW

[chest ap]
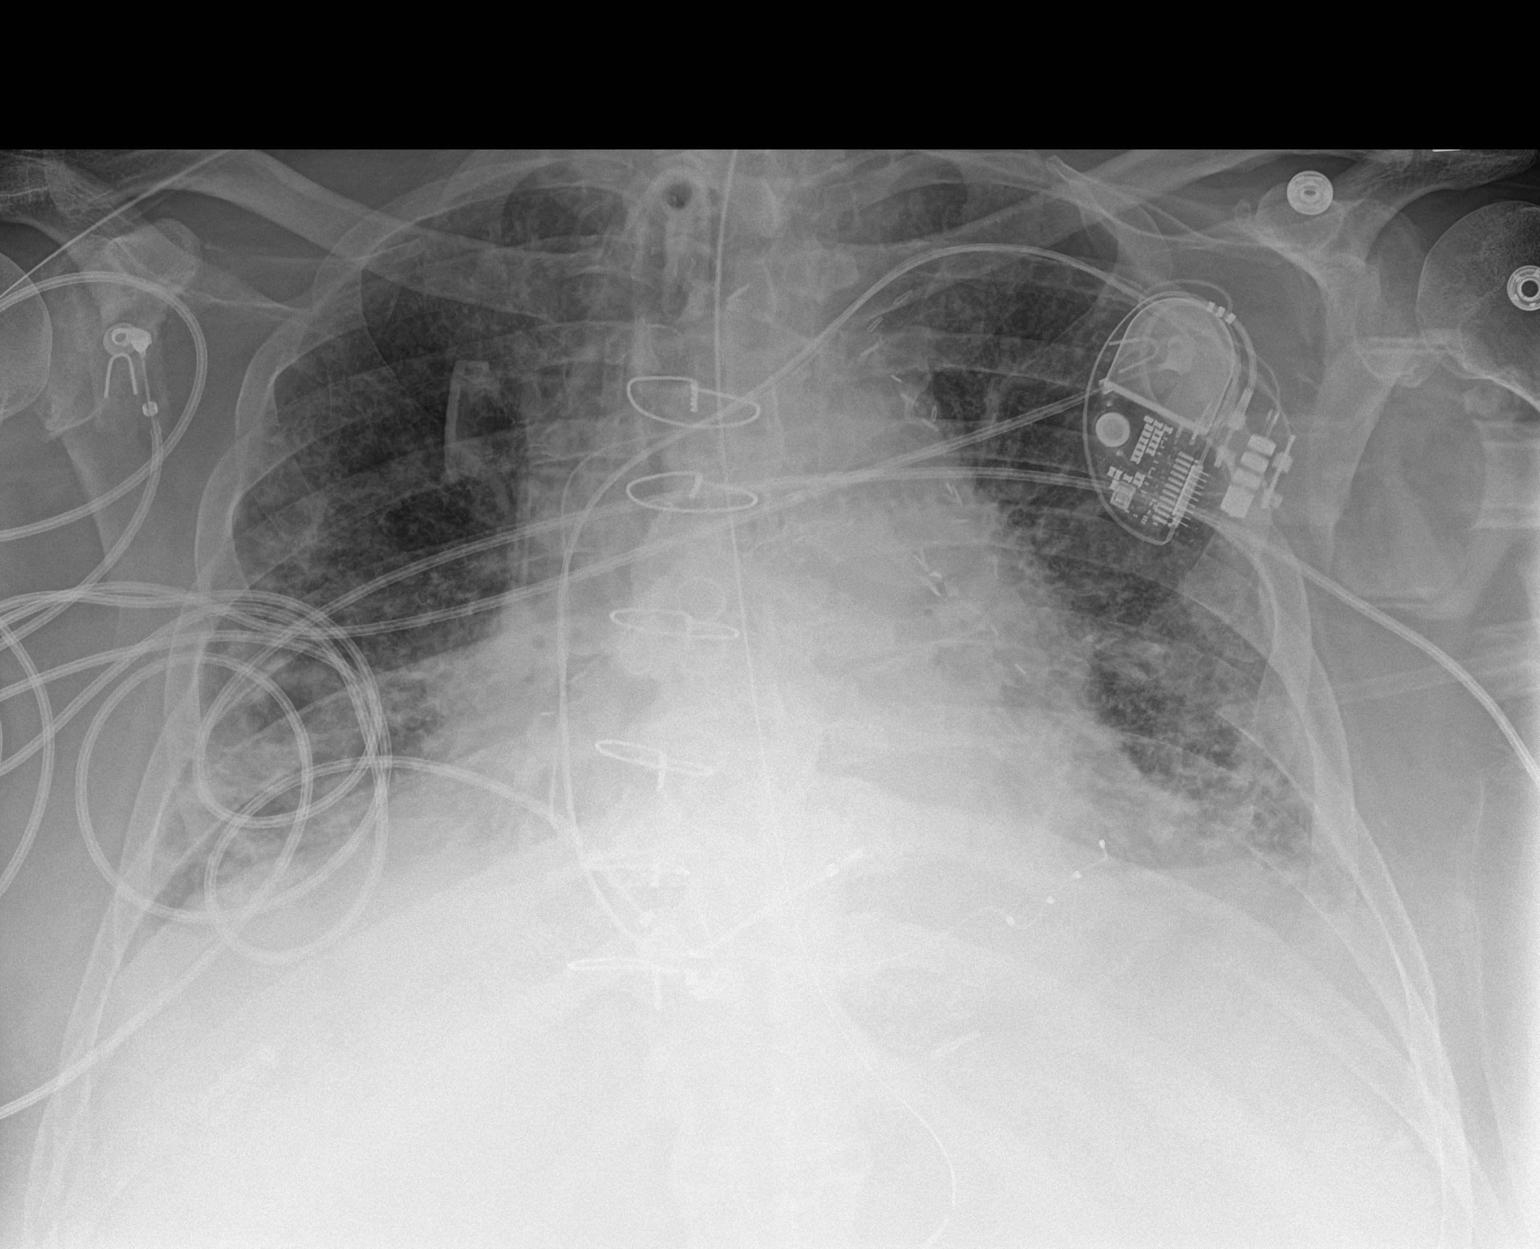

[1 of 1 positions shown; findings below may reference images not displayed]

FINDINGS: No significant interval change in AP portable chest radiograph with
diffuse bilateral interstitial pulmonary opacity and small layering
pleural effusions. No new airspace opacity. Tracheostomy.
Cardiomegaly status post median sternotomy with left chest multi
lead pacer. Esophagogastric tube with tip and side port below the
diaphragm.
IMPRESSION: No significant interval change in AP portable chest radiograph with
diffuse bilateral interstitial pulmonary opacity and small layering
pleural effusions, findings consistent with infection or edema. No
new airspace opacity. Tracheostomy.
